# Patient Record
Sex: Female | Born: 1955 | Race: Black or African American | Hispanic: No | Smoking: Current every day smoker
Health system: Southern US, Community
[De-identification: ages and names within clinical notes are randomized; demographics above are authoritative.]

## PROBLEM LIST (undated history)

## (undated) DIAGNOSIS — R05 Cough: Secondary | ICD-10-CM

## (undated) DIAGNOSIS — J069 Acute upper respiratory infection, unspecified: Secondary | ICD-10-CM

## (undated) DIAGNOSIS — J189 Pneumonia, unspecified organism: Secondary | ICD-10-CM

## (undated) DIAGNOSIS — M549 Dorsalgia, unspecified: Secondary | ICD-10-CM

## (undated) DIAGNOSIS — B1920 Unspecified viral hepatitis C without hepatic coma: Secondary | ICD-10-CM

## (undated) DIAGNOSIS — R7303 Prediabetes: Secondary | ICD-10-CM

## (undated) DIAGNOSIS — K7460 Unspecified cirrhosis of liver: Secondary | ICD-10-CM

## (undated) DIAGNOSIS — K859 Acute pancreatitis without necrosis or infection, unspecified: Secondary | ICD-10-CM

## (undated) DIAGNOSIS — I10 Essential (primary) hypertension: Secondary | ICD-10-CM

## (undated) DIAGNOSIS — J42 Unspecified chronic bronchitis: Secondary | ICD-10-CM

## (undated) HISTORY — PX: ABDOMINAL HYSTERECTOMY: SHX81

## (undated) HISTORY — DX: Acute upper respiratory infection, unspecified: J06.9

## (undated) HISTORY — DX: Unspecified viral hepatitis C without hepatic coma: B19.20

## (undated) HISTORY — DX: Unspecified cirrhosis of liver: K74.60

## (undated) HISTORY — DX: Cough: R05

---

## 1997-12-07 ENCOUNTER — Other Ambulatory Visit: Admission: RE | Admit: 1997-12-07 | Discharge: 1997-12-07 | Payer: Self-pay | Admitting: Family Medicine

## 1998-02-22 ENCOUNTER — Emergency Department (HOSPITAL_COMMUNITY): Admission: EM | Admit: 1998-02-22 | Discharge: 1998-02-22 | Payer: Self-pay | Admitting: Emergency Medicine

## 1998-07-18 ENCOUNTER — Emergency Department (HOSPITAL_COMMUNITY): Admission: EM | Admit: 1998-07-18 | Discharge: 1998-07-18 | Payer: Self-pay

## 1999-03-19 ENCOUNTER — Emergency Department (HOSPITAL_COMMUNITY): Admission: EM | Admit: 1999-03-19 | Discharge: 1999-03-19 | Payer: Self-pay | Admitting: Emergency Medicine

## 1999-03-29 ENCOUNTER — Encounter: Admission: RE | Admit: 1999-03-29 | Discharge: 1999-03-29 | Payer: Self-pay | Admitting: Internal Medicine

## 1999-03-29 ENCOUNTER — Ambulatory Visit (HOSPITAL_COMMUNITY): Admission: RE | Admit: 1999-03-29 | Discharge: 1999-03-29 | Payer: Self-pay | Admitting: Internal Medicine

## 1999-03-29 ENCOUNTER — Encounter: Payer: Self-pay | Admitting: Internal Medicine

## 1999-04-08 ENCOUNTER — Encounter: Admission: RE | Admit: 1999-04-08 | Discharge: 1999-04-08 | Payer: Self-pay | Admitting: Internal Medicine

## 1999-04-15 ENCOUNTER — Encounter: Admission: RE | Admit: 1999-04-15 | Discharge: 1999-04-15 | Payer: Self-pay | Admitting: Internal Medicine

## 1999-04-16 ENCOUNTER — Encounter: Admission: RE | Admit: 1999-04-16 | Discharge: 1999-04-16 | Payer: Self-pay | Admitting: Obstetrics & Gynecology

## 1999-04-16 ENCOUNTER — Other Ambulatory Visit: Admission: RE | Admit: 1999-04-16 | Discharge: 1999-04-16 | Payer: Self-pay | Admitting: *Deleted

## 1999-05-07 ENCOUNTER — Encounter: Admission: RE | Admit: 1999-05-07 | Discharge: 1999-05-07 | Payer: Self-pay | Admitting: Obstetrics & Gynecology

## 1999-05-13 ENCOUNTER — Encounter: Admission: RE | Admit: 1999-05-13 | Discharge: 1999-05-13 | Payer: Self-pay | Admitting: Internal Medicine

## 1999-06-28 ENCOUNTER — Encounter: Admission: RE | Admit: 1999-06-28 | Discharge: 1999-06-28 | Payer: Self-pay | Admitting: Internal Medicine

## 2002-12-09 ENCOUNTER — Ambulatory Visit (HOSPITAL_COMMUNITY): Admission: RE | Admit: 2002-12-09 | Discharge: 2002-12-09 | Payer: Self-pay | Admitting: Internal Medicine

## 2002-12-09 ENCOUNTER — Encounter: Payer: Self-pay | Admitting: Internal Medicine

## 2002-12-15 ENCOUNTER — Ambulatory Visit (HOSPITAL_COMMUNITY): Admission: RE | Admit: 2002-12-15 | Discharge: 2002-12-15 | Payer: Self-pay | Admitting: Internal Medicine

## 2003-04-13 ENCOUNTER — Emergency Department (HOSPITAL_COMMUNITY): Admission: EM | Admit: 2003-04-13 | Discharge: 2003-04-13 | Payer: Self-pay | Admitting: Emergency Medicine

## 2003-07-06 ENCOUNTER — Encounter: Admission: RE | Admit: 2003-07-06 | Discharge: 2003-07-06 | Payer: Self-pay | Admitting: Obstetrics and Gynecology

## 2003-08-14 ENCOUNTER — Encounter (INDEPENDENT_AMBULATORY_CARE_PROVIDER_SITE_OTHER): Payer: Self-pay | Admitting: Specialist

## 2003-08-14 ENCOUNTER — Inpatient Hospital Stay (HOSPITAL_COMMUNITY): Admission: RE | Admit: 2003-08-14 | Discharge: 2003-08-17 | Payer: Self-pay | Admitting: Family Medicine

## 2003-08-18 ENCOUNTER — Encounter: Admission: RE | Admit: 2003-08-18 | Discharge: 2003-08-18 | Payer: Self-pay | Admitting: Family Medicine

## 2003-09-01 ENCOUNTER — Encounter: Admission: RE | Admit: 2003-09-01 | Discharge: 2003-09-01 | Payer: Self-pay | Admitting: Family Medicine

## 2003-09-15 ENCOUNTER — Encounter: Admission: RE | Admit: 2003-09-15 | Discharge: 2003-09-15 | Payer: Self-pay | Admitting: Family Medicine

## 2004-05-26 ENCOUNTER — Emergency Department (HOSPITAL_COMMUNITY): Admission: EM | Admit: 2004-05-26 | Discharge: 2004-05-26 | Payer: Self-pay

## 2005-04-23 ENCOUNTER — Emergency Department (HOSPITAL_COMMUNITY): Admission: EM | Admit: 2005-04-23 | Discharge: 2005-04-23 | Payer: Self-pay | Admitting: Emergency Medicine

## 2005-04-30 ENCOUNTER — Ambulatory Visit: Payer: Self-pay | Admitting: Family Medicine

## 2007-07-09 ENCOUNTER — Emergency Department (HOSPITAL_COMMUNITY): Admission: EM | Admit: 2007-07-09 | Discharge: 2007-07-09 | Payer: Self-pay | Admitting: Emergency Medicine

## 2008-11-16 ENCOUNTER — Emergency Department (HOSPITAL_COMMUNITY): Admission: EM | Admit: 2008-11-16 | Discharge: 2008-11-16 | Payer: Self-pay | Admitting: Family Medicine

## 2008-11-22 ENCOUNTER — Encounter: Admission: RE | Admit: 2008-11-22 | Discharge: 2008-11-22 | Payer: Self-pay | Admitting: Emergency Medicine

## 2008-11-27 ENCOUNTER — Encounter: Admission: RE | Admit: 2008-11-27 | Discharge: 2008-11-27 | Payer: Self-pay | Admitting: Emergency Medicine

## 2009-03-16 ENCOUNTER — Emergency Department (HOSPITAL_COMMUNITY): Admission: EM | Admit: 2009-03-16 | Discharge: 2009-03-16 | Payer: Self-pay | Admitting: Emergency Medicine

## 2009-09-07 ENCOUNTER — Emergency Department (HOSPITAL_COMMUNITY): Admission: EM | Admit: 2009-09-07 | Discharge: 2009-09-07 | Payer: Self-pay | Admitting: Emergency Medicine

## 2010-04-05 ENCOUNTER — Emergency Department (HOSPITAL_COMMUNITY): Admission: EM | Admit: 2010-04-05 | Discharge: 2010-04-05 | Payer: Self-pay | Admitting: Emergency Medicine

## 2010-07-08 ENCOUNTER — Encounter: Payer: Self-pay | Admitting: Emergency Medicine

## 2010-09-08 LAB — BRAIN NATRIURETIC PEPTIDE: Pro B Natriuretic peptide (BNP): <30 pg/mL (ref 0.0–100.0)

## 2010-09-23 LAB — CBC
HCT: 41.6 % (ref 36.0–46.0)
Hemoglobin: 14.3 g/dL (ref 12.0–15.0)
MCHC: 34.5 g/dL (ref 30.0–36.0)
MCV: 87.1 fL (ref 78.0–100.0)
Platelets: 217 10*3/uL (ref 150–400)
RBC: 4.77 MIL/uL (ref 3.87–5.11)
RDW: 13 % (ref 11.5–15.5)
WBC: 7.2 10*3/uL (ref 4.0–10.5)

## 2010-09-23 LAB — DIFFERENTIAL
Basophils Absolute: 0 10*3/uL (ref 0.0–0.1)
Basophils Relative: 0 % (ref 0–1)
Eosinophils Absolute: 0.1 10*3/uL (ref 0.0–0.7)
Eosinophils Relative: 2 % (ref 0–5)
Lymphocytes Relative: 39 % (ref 12–46)
Lymphs Abs: 2.8 10*3/uL (ref 0.7–4.0)
Monocytes Absolute: 0.5 10*3/uL (ref 0.1–1.0)
Monocytes Relative: 7 % (ref 3–12)
Neutro Abs: 3.8 10*3/uL (ref 1.7–7.7)
Neutrophils Relative %: 52 % (ref 43–77)

## 2010-09-23 LAB — COMPREHENSIVE METABOLIC PANEL
ALT: 64 U/L — ABNORMAL HIGH (ref 0–35)
AST: 51 U/L — ABNORMAL HIGH (ref 0–37)
Albumin: 3.9 g/dL (ref 3.5–5.2)
Alkaline Phosphatase: 78 U/L (ref 39–117)
BUN: 6 mg/dL (ref 6–23)
CO2: 28 mEq/L (ref 19–32)
Calcium: 10 mg/dL (ref 8.4–10.5)
Chloride: 107 mEq/L (ref 96–112)
Creatinine, Ser: 0.82 mg/dL (ref 0.4–1.2)
GFR calc Af Amer: >60 mL/min (ref >60)
GFR calc non Af Amer: >60 mL/min (ref >60)
Glucose, Bld: 107 mg/dL — ABNORMAL HIGH (ref 70–99)
Potassium: 3.7 mEq/L (ref 3.5–5.1)
Sodium: 139 mEq/L (ref 135–145)
Total Bilirubin: 0.8 mg/dL (ref 0.3–1.2)
Total Protein: 8.1 g/dL (ref 6.0–8.3)

## 2010-09-23 LAB — POCT URINALYSIS DIP (DEVICE)
Bilirubin Urine: NEGATIVE
Glucose, UA: NEGATIVE mg/dL
Hgb urine dipstick: NEGATIVE
Ketones, ur: NEGATIVE mg/dL
Nitrite: NEGATIVE
Protein, ur: NEGATIVE mg/dL
Specific Gravity, Urine: 1.025 (ref 1.005–1.030)
Urobilinogen, UA: 0.2 mg/dL (ref 0.0–1.0)
pH: 5.5 (ref 5.0–8.0)

## 2010-11-01 NOTE — Op Note (Signed)
Melanie Guzman, Melanie Guzman                        ACCOUNT NO.:  [id number]   MEDICAL RECORD NO.:  [id number]                   PATIENT TYPE:  INP   LOCATION:  9317                                 FACILITY:  [facility]   PHYSICIAN:  Tanya S. Shawnie Pons, M.D.                DATE OF BIRTH:  01-10-1956   DATE OF PROCEDURE:  08/14/2003  DATE OF DISCHARGE:                                 OPERATIVE REPORT   PREOPERATIVE DIAGNOSES:  1. Fibroid uterus.  2. Menorrhagia.  3. Dysmenorrhea.   POSTOPERATIVE DIAGNOSES:  1. Fibroid uterus.  2. Menorrhagia.  3. Dysmenorrhea.   PROCEDURES:  1. Total abdominal hysterectomy.  2. Lysis of adhesions.  3. Cystoscopy.   SURGEON:  Shelbie Proctor. Shawnie Pons, M.D.   ASSISTANTS:  Lesly Dukes, M.D., and Javier Glazier. Okey Dupre, M.D.   ANESTHESIA:  General, Burnett Corrente, M.D.   FINDINGS:  Large fibroid uterus.  Numerous adhesions in the pelvis.  There  were also adhesions of the omentum to the uterus.   ESTIMATED BLOOD LOSS:  2500 mL.  The patient then received two units of  packed red blood cells intraoperatively.   COMPLICATIONS:  None.   SPECIMENS:  Uterus to pathology.   REASON FOR PROCEDURE:  The patient is a 55 year old gravida 2, para 2, who  had a history of PID with an IUD in the 1970s, who has a large fibroid  uterus, who continued to have menorrhagia and dysmenorrhea despite being on  Depo-Provera.  She desired permanent solution to her problem.   DESCRIPTION OF PROCEDURE:  The patient was taken to the operating room,  where she was prepped and draped in the usual sterile fashion.  A  Pfannenstiel incision was then used with a knife.  This was carried down to  the underlying fascia with electrocautery.  The fascia was then entered  sharply and the incision extended laterally with the Mayo scissors.  The  superior edge of the fascia was then dissected off the underlying rectus  bluntly laterally and sharply in the midline.  Similarly the posterior edge  was  also dissected off the underlying rectus.  The peritoneal cavity was  then entered sharply and the uterus found.  Some adhesions were noted  initially.  There was an adhesion of the omentum across the anterior portion  of the cervix.  This was doubly clamped and ligated.  Attention was then  turned to the round ligaments, which were grasped, suture ligated, and then  cut with the electrocautery.  A bladder flap was then created and the  bladder pushed down off the uterus.  The tube and ovary were noted to be  stuck to the posterior cul-de-sac, so a decision was made to leave the tube  and ovary.  The tubo-ovarian pedicle was then grasped on the patient's left  side and clamped and secured with a free tie followed by a suture ligature  with  the Kelly clamp remaining on the uterus for back bleeding.  The tubo-  ovarian pedicle was also grasped on the right side and was held with a free  tie and then a suture ligature.  Immediately there was significant bleeding  from that side; however, multiple attempts to try to find the bleeding were  not possible.  It was then noted that because of adhesions, the true tubo-  ovarian ligament was more to the posterior and dorsal of this area, so  clamps were used across this.  There was still some bleeding noted from the  pedicle on that side.  The blood loss got to approximately 1500, and blood  was ordered for the patient.  After this, many of the clamps were then used  for suture ligature until hemostasis was obtained.  An attempt was then made  to elevate the uterus up out of the abdomen; however, it could not be done.  A decision was then made to convert the Pfannenstiel to a Maylard and the  rectus muscles were divided to improve visualization.  It just became more  and more difficult to elevate the uterus out of the pelvis and to be able to  see to get the uterine arteries.  At that point another surgeon was asked to  come in and help with  retraction.  The uterines were then doubly clamped  with a Heaney clamp on the patient's left side and suture ligature put about  each clamp.  The right side uterine artery was similarly clamped with Heaney  clamps x2 and ligated.  There was good hemostasis noted and multiple  myomectomies were done to decrease the bulk of the uterus.  There was  minimal bleeding from the myomectomy sites after the uterines were clamped.  Once the uterus was properly debulked, a Kocher was placed on each side of  the cervix and dissected off.  Suture ligature was then used to hold this  pedicle.  The vagina was then entered sharply with the Mayo scissors and the  cervix was removed.  The  angles of the cardinal ligaments were then sewn in  to the top of the vaginal cuff.  The vaginal cuff closed using a #1 Vicryl  suture in a locked running fashion.  There was still some bleeding from the  edge of the last pedicle to the angle stitch, and this was closed with a  figure-of-eight.  Also, from dissection of trying to get the posterior  peritoneum or the adhesions in the posterior, there was some bleeding in the  pouch of [place].  This was oversewn with a 2-0 Vicryl with good hemostasis  again noted.  The abdominal cavity was then irrigated.  Both ovaries were  noted to be stuck down posteriorly with the tubes.  It was felt that given  the amount of surgery time that this patient has already endured as well as  the amount of bleeding, that it was best to leave these organs in situ.  Should the patient need further surgery for these organs in the future, I  would suggest GYN oncology.  When the abdomen was completely dry and all  instrument, needle, and lap counts were correct, the Balfour retractor was  removed from the incision.  The rectus muscles were inspected and any  bleeders cauterized.  The fascia was closed with a #1 Vicryl suture in a running fashion.  The subcutaneous tissue irrigated, any bleeders   cauterized, and the skin closed using  clips.  Postoperatively the patient  was placed in dorsal lithotomy position in [place] stirrups.  She was given  indigo carmine IV and cystoscopy was performed.  There were noted to be no  holes in the bladder.  The trigone was visualized, and there were blue jets  coming from each ostium.  A picture was taken of each of these.  The patient  was then taken out of dorsal lithotomy.  Attention was turned back to the  abdomen and the abdominal incision then injected with 0.25% Marcaine 10 mL.  The patient tolerated the procedure well.  All instrument, needle, and lap  counts were correct x2.  The patient was awakened and taken to the recovery  room in stable condition.                                               Shelbie Proctor. Shawnie Pons, M.D.    TSP/MEDQ  D:  08/14/2003  T:  08/14/2003  Job:  [id number]

## 2010-11-01 NOTE — Discharge Summary (Signed)
NAMELIBRA, GATZ                        ACCOUNT NO.:  [id number]   MEDICAL RECORD NO.:  [id number]                   PATIENT TYPE:  INP   LOCATION:  9317                                 FACILITY:  [facility]   PHYSICIAN:  Tanya S. Shawnie Pons, M.D.                DATE OF BIRTH:  [birth year]   DATE OF ADMISSION:  08/14/2003  DATE OF DISCHARGE:  08/17/2003                                 DISCHARGE SUMMARY   FINAL DIAGNOSES:  1. Fibroid uterus.  2. Extensive pelvic adhesive disease.  3. History of asthma.   SIGNIFICANT LABORATORY FINDINGS:  Preoperative hemoglobin of 14.1 and  postoperative 9.7. Blood type is O positive. Urine culture was negative.  Other platelet counts and coags were also within normal limits.   OPERATION/PROCEDURE:  The patient had a TAH with lysis of adhesions,  cystoscopy, and two units of packed red blood cells.   REASON FOR ADMISSION:  Briefly, the patient is a 55 year old, gravida 2,  para 2, who had a large fibroid uterus with pain, menorrhagia, and history  of anemia, who failed medical treatment and desired permanent treatment. She  was admitted for the above surgery.   HOSPITAL COURSE:  The patient was admitted on the day of surgery where she  underwent the above procedure. Please see operative note for full details.  However there was extensive dissection, lots of adhesions, and a significant  amount of bleeding with 2.5 liter blood loss. She had two units of packed  red blood cell transfusion intraoperatively and cystoscopy performed to  insure the integrity of the ureters. Postoperatively she was transferred to  the floor where she did well. Her postoperative hemoglobin was 11.4, down to  9.7 on postoperative day #1. She remained afebrile and her catheter was  removed on postoperative day #1.  Her PCA was discontinued. She was  tolerating p.o. On postoperative day #2, she started passing gas and not  having any significant nausea and vomiting. On  postoperative day #3, she had  passed gas, was voiding easily on her own, had no nausea and vomiting, and  was tolerating a p.o. diet, had her pain well controlled with Percocet. At  that time it was felt that she was stable for discharge.   DISCHARGE DISPOSITION AND CONDITION:  The patient discharged home in good  condition. Follow up will be tomorrow for staple removal at 8:30 in the  morning.   DISCHARGE MEDICATIONS:  She can resume her home medications as well as she  is given a prescription for Percocet 5/325 mg one to two p.o. q.4-6h.  p.r.n., #48, with no refills. The patient is also instructed to return with  significant fever, persistent nausea and vomiting. She also has a two week  follow-up in the GYN clinic for postoperative check. She is to do no heavy  lifting for the next six weeks.  Shelbie Proctor. Shawnie Pons, M.D.    TSP/MEDQ  D:  08/17/2003  T:  08/18/2003  Job:  [id number]

## 2010-11-01 NOTE — Group Therapy Note (Signed)
NAME:  Melanie Guzman, CRIBB NO.:  [id number]   MEDICAL RECORD NO.:  [id number]                   PATIENT TYPE:  OUT   LOCATION:  [facility]                           FACILITY:  [facility]   PHYSICIAN:  Tinnie Gens, MD                     DATE OF BIRTH:  May 23, 1956   DATE OF SERVICE:  07/06/2003                                    CLINIC NOTE   CHIEF COMPLAINT:  Fibroid uterus.   HISTORY OF PRESENT ILLNESS:  The patient is a 55 year old gravida 2, para 2-  0-0-2, who is referred by Dr. Audie Box for a large fibroid uterus.  Apparently, her fibroids are getting larger.  Initially, she was having  significant menorrhagia with anemia.  She has been placed on Depo since that  time but has developed significant pain related to her fibroids.  They seem  to be growing in size.  She has had several ultrasounds that have documented  this.  She has also had a sonohysterogram that showed a normal endometrial  canal.  She does have an EMV that I do not have the exact results for.   PAST MEDICAL HISTORY:  Significant for asthma, bronchitis.   PAST SURGICAL HISTORY:  Negative.   OBSTETRICAL HISTORY:  She has had two spontaneous vaginal delivery.   GYNECOLOGICAL HISTORY:  She had a history of PID secondary to IUD placement  in the 70s.  She has a history of abnormal Pap.  Her last was normal in June  of 2004.   ALLERGIES:  None known.   MEDICATIONS:  Depo-Provera as well as hydrocodone p.r.n. for pain.   FAMILY HISTORY:  Colon cancer in her grandmother.  Coronary artery disease,  stroke in a grandmother.   PAST SOCIAL HISTORY:  She has a history of tobacco use at least greater than  1 pack per day.   A 14-point review of systems is reviewed and is negative with the exception  of occasional indigestion.  She denies significant chest pain with exertion  but does complain of some mild dyspnea on exertion related to her smoking.   PHYSICAL EXAMINATION:  VITAL SIGNS:   Temperature is 98, pulse 111, blood  pressure is 142/89, weight 225.4.  GENERAL:  She is a well-developed, well-nourished black female in no acute  distress.  HEENT:  [place]/AT.  Sclerae anicteric.  NECK:  Supple.  Question of enlarged thyroid.  LUNGS:  Clear bilaterally.  HEART:  Regular rate and rhythm.  No rubs, gallops, or murmurs.  BREASTS:  Deferred.  ABDOMEN:  Shows a mass in the lower center quadrant, that is irregular and  consistent with fibroid uterus.  She has no significant lymphadenopathy.  EXTREMITIES:  No cyanosis or clubbing.  She has trace edema.  There is no  significant rash.   IMPRESSION:  Fibroid uterus.   PLAN:  The plan is for a TAH.  Discussed with the patient the  risks and  benefits of this procedure including bleeding, infection, injury to  surrounding organs and even death.  The patient understood these risks and  after a lengthy discussion decided she would like to keep her ovaries.  We  will schedule her for a TAH as soon as possible.                                               Tinnie Gens, MD    TP/MEDQ  D:  07/06/2003  T:  07/07/2003  Job:  [id number]

## 2011-01-08 ENCOUNTER — Emergency Department (HOSPITAL_COMMUNITY): Payer: Self-pay

## 2011-01-08 ENCOUNTER — Emergency Department (HOSPITAL_COMMUNITY)
Admission: EM | Admit: 2011-01-08 | Discharge: 2011-01-08 | Disposition: A | Discharge status: Home / Self Care | Payer: Self-pay | Attending: Emergency Medicine | Admitting: Emergency Medicine

## 2011-01-08 DIAGNOSIS — R5381 Other malaise: Secondary | ICD-10-CM | POA: Insufficient documentation

## 2011-01-08 DIAGNOSIS — R05 Cough: Secondary | ICD-10-CM | POA: Insufficient documentation

## 2011-01-08 DIAGNOSIS — R51 Headache: Secondary | ICD-10-CM | POA: Insufficient documentation

## 2011-01-08 DIAGNOSIS — R110 Nausea: Secondary | ICD-10-CM | POA: Insufficient documentation

## 2011-01-08 DIAGNOSIS — R5383 Other fatigue: Secondary | ICD-10-CM | POA: Insufficient documentation

## 2011-01-08 DIAGNOSIS — R42 Dizziness and giddiness: Secondary | ICD-10-CM | POA: Insufficient documentation

## 2011-01-08 DIAGNOSIS — R059 Cough, unspecified: Secondary | ICD-10-CM | POA: Insufficient documentation

## 2011-01-08 DIAGNOSIS — I10 Essential (primary) hypertension: Secondary | ICD-10-CM | POA: Insufficient documentation

## 2011-01-08 DIAGNOSIS — R109 Unspecified abdominal pain: Secondary | ICD-10-CM | POA: Insufficient documentation

## 2011-01-08 DIAGNOSIS — R079 Chest pain, unspecified: Secondary | ICD-10-CM | POA: Insufficient documentation

## 2011-01-08 LAB — URINALYSIS, ROUTINE W REFLEX MICROSCOPIC
Glucose, UA: NEGATIVE mg/dL
Hgb urine dipstick: NEGATIVE
Leukocytes, UA: NEGATIVE
Nitrite: NEGATIVE
Protein, ur: NEGATIVE mg/dL
Specific Gravity, Urine: 1.019 (ref 1.005–1.030)
Urobilinogen, UA: 0.2 mg/dL (ref 0.0–1.0)
pH: 6 (ref 5.0–8.0)

## 2011-01-08 LAB — DIFFERENTIAL
Basophils Absolute: 0 10*3/uL (ref 0.0–0.1)
Basophils Relative: 0 % (ref 0–1)
Eosinophils Relative: 2 % (ref 0–5)
Lymphs Abs: 2.5 10*3/uL (ref 0.7–4.0)
Monocytes Absolute: 0.5 10*3/uL (ref 0.1–1.0)
Monocytes Relative: 7 % (ref 3–12)
Neutro Abs: 4.1 10*3/uL (ref 1.7–7.7)
Neutrophils Relative %: 56 % (ref 43–77)

## 2011-01-08 LAB — CBC
HCT: 41 % (ref 36.0–46.0)
Hemoglobin: 14.2 g/dL (ref 12.0–15.0)
MCHC: 34.6 g/dL (ref 30.0–36.0)
MCV: 85.4 fL (ref 78.0–100.0)
Platelets: 185 10*3/uL (ref 150–400)
RBC: 4.8 MIL/uL (ref 3.87–5.11)
WBC: 7.2 10*3/uL (ref 4.0–10.5)

## 2011-01-08 LAB — BASIC METABOLIC PANEL
BUN: 7 mg/dL (ref 6–23)
CO2: 25 mEq/L (ref 19–32)
Calcium: 10 mg/dL (ref 8.4–10.5)
Creatinine, Ser: 0.71 mg/dL (ref 0.50–1.10)
GFR calc non Af Amer: >60 mL/min (ref >60)
Glucose, Bld: 94 mg/dL (ref 70–99)
Potassium: 3.8 mEq/L (ref 3.5–5.1)
Sodium: 141 mEq/L (ref 135–145)

## 2011-01-08 LAB — TROPONIN I: Troponin I: <0.3 ng/mL (ref <0.30)

## 2011-01-09 LAB — URINE CULTURE: Colony Count: 15000

## 2011-01-20 ENCOUNTER — Emergency Department (HOSPITAL_COMMUNITY)
Admission: EM | Admit: 2011-01-20 | Discharge: 2011-01-20 | Disposition: A | Discharge status: Home / Self Care | Payer: Self-pay | Attending: Emergency Medicine | Admitting: Emergency Medicine

## 2011-01-20 DIAGNOSIS — M542 Cervicalgia: Secondary | ICD-10-CM | POA: Insufficient documentation

## 2011-01-20 DIAGNOSIS — R079 Chest pain, unspecified: Secondary | ICD-10-CM | POA: Insufficient documentation

## 2011-01-20 DIAGNOSIS — M549 Dorsalgia, unspecified: Secondary | ICD-10-CM | POA: Insufficient documentation

## 2011-01-20 DIAGNOSIS — I10 Essential (primary) hypertension: Secondary | ICD-10-CM | POA: Insufficient documentation

## 2011-01-20 DIAGNOSIS — R51 Headache: Secondary | ICD-10-CM | POA: Insufficient documentation

## 2011-01-20 DIAGNOSIS — R059 Cough, unspecified: Secondary | ICD-10-CM | POA: Insufficient documentation

## 2011-01-20 DIAGNOSIS — R05 Cough: Secondary | ICD-10-CM | POA: Insufficient documentation

## 2011-01-20 DIAGNOSIS — J40 Bronchitis, not specified as acute or chronic: Secondary | ICD-10-CM | POA: Insufficient documentation

## 2011-01-20 DIAGNOSIS — J9801 Acute bronchospasm: Secondary | ICD-10-CM | POA: Insufficient documentation

## 2011-02-03 ENCOUNTER — Emergency Department (HOSPITAL_COMMUNITY)
Admission: EM | Admit: 2011-02-03 | Discharge: 2011-02-04 | Disposition: A | Discharge status: Home / Self Care | Payer: Self-pay | Attending: Emergency Medicine | Admitting: Emergency Medicine

## 2011-02-03 DIAGNOSIS — R110 Nausea: Secondary | ICD-10-CM | POA: Insufficient documentation

## 2011-02-03 DIAGNOSIS — R109 Unspecified abdominal pain: Secondary | ICD-10-CM | POA: Insufficient documentation

## 2011-02-03 DIAGNOSIS — R10819 Abdominal tenderness, unspecified site: Secondary | ICD-10-CM | POA: Insufficient documentation

## 2011-02-03 DIAGNOSIS — I10 Essential (primary) hypertension: Secondary | ICD-10-CM | POA: Insufficient documentation

## 2011-02-03 LAB — URINALYSIS, ROUTINE W REFLEX MICROSCOPIC
Glucose, UA: NEGATIVE mg/dL
Hgb urine dipstick: NEGATIVE
Leukocytes, UA: NEGATIVE
Nitrite: NEGATIVE
Protein, ur: NEGATIVE mg/dL
Specific Gravity, Urine: 1.009 (ref 1.005–1.030)
Urobilinogen, UA: 0.2 mg/dL (ref 0.0–1.0)
pH: 5.5 (ref 5.0–8.0)

## 2011-02-04 ENCOUNTER — Emergency Department (HOSPITAL_COMMUNITY): Payer: Self-pay

## 2011-02-04 LAB — DIFFERENTIAL
Lymphocytes Relative: 31 % (ref 12–46)
Lymphs Abs: 2.8 10*3/uL (ref 0.7–4.0)
Monocytes Relative: 6 % (ref 3–12)
Neutro Abs: 5.4 10*3/uL (ref 1.7–7.7)
Neutrophils Relative %: 61 % (ref 43–77)

## 2011-02-04 LAB — CBC
HCT: 44.8 % (ref 36.0–46.0)
Hemoglobin: 16.1 g/dL — ABNORMAL HIGH (ref 12.0–15.0)
MCH: 30.4 pg (ref 26.0–34.0)
MCV: 84.7 fL (ref 78.0–100.0)
RBC: 5.29 MIL/uL — ABNORMAL HIGH (ref 3.87–5.11)

## 2011-02-04 LAB — COMPREHENSIVE METABOLIC PANEL
ALT: 55 U/L — ABNORMAL HIGH (ref 0–35)
CO2: 30 mEq/L (ref 19–32)
Calcium: 11.2 mg/dL — ABNORMAL HIGH (ref 8.4–10.5)
Creatinine, Ser: 0.69 mg/dL (ref 0.50–1.10)
GFR calc Af Amer: >60 mL/min (ref >60)
GFR calc non Af Amer: >60 mL/min (ref >60)
Glucose, Bld: 113 mg/dL — ABNORMAL HIGH (ref 70–99)
Sodium: 136 mEq/L (ref 135–145)

## 2011-03-04 ENCOUNTER — Other Ambulatory Visit (HOSPITAL_COMMUNITY): Payer: Self-pay | Admitting: Family Medicine

## 2011-03-04 DIAGNOSIS — M545 Low back pain: Secondary | ICD-10-CM

## 2011-03-05 ENCOUNTER — Other Ambulatory Visit (HOSPITAL_COMMUNITY): Payer: Self-pay | Admitting: Family Medicine

## 2011-03-05 DIAGNOSIS — M545 Low back pain: Secondary | ICD-10-CM

## 2011-03-11 ENCOUNTER — Inpatient Hospital Stay (HOSPITAL_COMMUNITY): Admission: RE | Admit: 2011-03-11 | Payer: Self-pay | Source: Ambulatory Visit

## 2011-03-18 ENCOUNTER — Ambulatory Visit (HOSPITAL_COMMUNITY)
Admission: RE | Admit: 2011-03-18 | Discharge: 2011-03-18 | Disposition: A | Discharge status: Home / Self Care | Payer: Self-pay | Source: Ambulatory Visit | Attending: Family Medicine | Admitting: Family Medicine

## 2011-03-18 DIAGNOSIS — IMO0002 Reserved for concepts with insufficient information to code with codable children: Secondary | ICD-10-CM | POA: Insufficient documentation

## 2011-03-18 DIAGNOSIS — M47817 Spondylosis without myelopathy or radiculopathy, lumbosacral region: Secondary | ICD-10-CM | POA: Insufficient documentation

## 2011-03-18 DIAGNOSIS — M48061 Spinal stenosis, lumbar region without neurogenic claudication: Secondary | ICD-10-CM | POA: Insufficient documentation

## 2011-03-18 DIAGNOSIS — M545 Low back pain, unspecified: Secondary | ICD-10-CM | POA: Insufficient documentation

## 2011-03-18 DIAGNOSIS — M79609 Pain in unspecified limb: Secondary | ICD-10-CM | POA: Insufficient documentation

## 2011-05-14 ENCOUNTER — Emergency Department (HOSPITAL_COMMUNITY)
Admission: EM | Admit: 2011-05-14 | Discharge: 2011-05-15 | Disposition: A | Discharge status: Home / Self Care | Payer: Self-pay | Attending: Emergency Medicine | Admitting: Emergency Medicine

## 2011-05-14 ENCOUNTER — Emergency Department (HOSPITAL_COMMUNITY): Payer: Self-pay

## 2011-05-14 DIAGNOSIS — S93609A Unspecified sprain of unspecified foot, initial encounter: Secondary | ICD-10-CM | POA: Insufficient documentation

## 2011-05-14 DIAGNOSIS — M7989 Other specified soft tissue disorders: Secondary | ICD-10-CM | POA: Insufficient documentation

## 2011-05-14 DIAGNOSIS — X500XXA Overexertion from strenuous movement or load, initial encounter: Secondary | ICD-10-CM | POA: Insufficient documentation

## 2011-05-14 HISTORY — DX: Essential (primary) hypertension: I10

## 2011-05-14 NOTE — ED Notes (Signed)
LT foot/ankle pain after missing a step.  Describes foot having externally rotated.  States pain is in bottom of foot (arch) and radiates up to hamstring.

## 2011-05-15 MED ORDER — HYDROCODONE-ACETAMINOPHEN 5-325 MG PO TABS
1.0000 | ORAL_TABLET | Freq: Once | ORAL | Status: AC
  Administered 2011-05-15: 1 via ORAL
  Filled 2011-05-15: qty 1

## 2011-05-15 MED ORDER — IBUPROFEN 800 MG PO TABS: 800.0000 mg | ORAL_TABLET | Freq: Three times a day (TID) | ORAL | Status: AC

## 2011-05-15 MED ORDER — HYDROCODONE-ACETAMINOPHEN 5-325 MG PO TABS: 1.0000 | ORAL_TABLET | ORAL | Status: AC | PRN

## 2011-05-15 NOTE — ED Notes (Signed)
ALP at bedside.

## 2011-05-15 NOTE — ED Provider Notes (Signed)
Medical screening examination/treatment/procedure(s) were performed by non-physician practitioner and as supervising physician I was immediately available for consultation/collaboration.  Olivia Mackie, MD 05/15/11 [phone]

## 2011-05-15 NOTE — ED Provider Notes (Signed)
History     CSN: [id number] Arrival date & time: 05/14/2011  9:32 PM   First MD Initiated Contact with Patient 05/15/11 0258      Chief Complaint  Patient presents with  . Foot Pain    Left foot, tripped 2 days ago.  . Foot Injury    (Consider location/radiation/quality/duration/timing/severity/associated sxs/prior treatment) Patient is a 55 y.o. female presenting with lower extremity pain and foot injury. The history is provided by the patient.  Foot Pain This is a new problem. The current episode started in the past 7 days. The problem occurs constantly. The problem has been unchanged (She twisted her foot while walking 2 days ago and has persistent pain in foot and ankle.). Pertinent negatives include no chills or fever. The symptoms are aggravated by walking and standing.  Foot Injury     Past Medical History  Diagnosis Date  . Hypertension     Past Surgical History  Procedure Date  . Abdominal hysterectomy     partial    History reviewed. No pertinent family history.  History  Substance Use Topics  . Smoking status: Current Everyday Smoker -- 1.0 packs/day  . Smokeless tobacco: Not on file  . Alcohol Use: Yes     occasionally    OB History    Grav Para Term Preterm Abortions TAB SAB Ect Mult Living                  Review of Systems  Constitutional: Negative for fever and chills.  HENT: Negative.   Respiratory: Negative.   Cardiovascular: Negative.   Gastrointestinal: Negative.   Musculoskeletal:       See HPI.  Skin: Negative.   Neurological: Negative.     Allergies  Review of patient's allergies indicates no known allergies.  Home Medications   Current Outpatient Rx  Name Route Sig Dispense Refill  . HYDROCHLOROTHIAZIDE 25 MG PO TABS Oral Take 25 mg by mouth daily.        BP 122/73  Pulse 79  Temp(Src) 98.2 F (36.8 C) (Oral)  Resp 20  SpO2 99%  Physical Exam  Constitutional: She is oriented to person, place, and time. She  appears well-developed and well-nourished.  Neck: Normal range of motion.  Pulmonary/Chest: Effort normal.  Musculoskeletal:       Left foot without swelling or discoloration. Tender to plantar foot overlying 1st MTP. Left ankle swollen medially without discoloration. Joint stable and nontender.   Neurological: She is alert and oriented to person, place, and time.  Skin: Skin is warm and dry.    ED Course  Procedures (including critical care time)  Labs Reviewed - No data to display Dg Ankle Complete Left  05/14/2011  *RADIOLOGY REPORT*  Clinical Data:  Left ankle pain after twisting injury.  LEFT ANKLE COMPLETE - 3+ VIEW  Comparison:  None.  Findings:  There is no evidence of fracture, dislocation, or joint effusion.  There is no evidence of arthropathy or other focal bone abnormality.  Soft tissues are unremarkable.  IMPRESSION: Negative.  Original Report Authenticated By: Reola Calkins, M.D.   Dg Foot Complete Left  05/14/2011  *RADIOLOGY REPORT*  Clinical Data: Twisting injury of left foot.  LEFT FOOT - COMPLETE 3+ VIEW  Comparison:  None.  Findings:  There is no evidence of fracture or dislocation.  There is no evidence of arthropathy or other focal bone abnormality. Soft tissues are unremarkable.  IMPRESSION: Negative.  Original Report Authenticated By: Reola Calkins,  M.D.     No diagnosis found.    MDM          Rodena Medin, PA 05/15/11 [phone]

## 2012-03-18 ENCOUNTER — Encounter (HOSPITAL_COMMUNITY): Payer: Self-pay | Admitting: Emergency Medicine

## 2012-03-18 ENCOUNTER — Emergency Department (HOSPITAL_COMMUNITY): Payer: Self-pay

## 2012-03-18 ENCOUNTER — Emergency Department (HOSPITAL_COMMUNITY)
Admission: EM | Admit: 2012-03-18 | Discharge: 2012-03-18 | Disposition: A | Discharge status: Home / Self Care | Payer: Self-pay | Attending: Emergency Medicine | Admitting: Emergency Medicine

## 2012-03-18 DIAGNOSIS — M549 Dorsalgia, unspecified: Secondary | ICD-10-CM

## 2012-03-18 DIAGNOSIS — M62838 Other muscle spasm: Secondary | ICD-10-CM | POA: Insufficient documentation

## 2012-03-18 DIAGNOSIS — I10 Essential (primary) hypertension: Secondary | ICD-10-CM | POA: Insufficient documentation

## 2012-03-18 LAB — URINALYSIS, ROUTINE W REFLEX MICROSCOPIC
Bilirubin Urine: NEGATIVE
Hgb urine dipstick: NEGATIVE
Protein, ur: NEGATIVE mg/dL
Urobilinogen, UA: 1 mg/dL (ref 0.0–1.0)

## 2012-03-18 MED ORDER — CYCLOBENZAPRINE HCL 10 MG PO TABS: 10.0000 mg | ORAL_TABLET | Freq: Three times a day (TID) | ORAL | Status: DC | PRN

## 2012-03-18 MED ORDER — OXYCODONE-ACETAMINOPHEN 5-325 MG PO TABS: 1.0000 | ORAL_TABLET | ORAL | Status: DC | PRN

## 2012-03-18 MED ORDER — CYCLOBENZAPRINE HCL 10 MG PO TABS
10.0000 mg | ORAL_TABLET | Freq: Once | ORAL | Status: AC
  Administered 2012-03-18: 10 mg via ORAL
  Filled 2012-03-18: qty 1

## 2012-03-18 MED ORDER — IBUPROFEN 800 MG PO TABS
800.0000 mg | ORAL_TABLET | Freq: Once | ORAL | Status: AC
  Administered 2012-03-18: 800 mg via ORAL
  Filled 2012-03-18: qty 1

## 2012-03-18 MED ORDER — OXYCODONE-ACETAMINOPHEN 5-325 MG PO TABS
1.0000 | ORAL_TABLET | Freq: Once | ORAL | Status: AC
  Administered 2012-03-18: 1 via ORAL
  Filled 2012-03-18: qty 1

## 2012-03-18 NOTE — Progress Notes (Deleted)
Pt with no pcp nor coverage CM spoke with pt to review list of self pay pcps to further assist her with prescriptions and health care.  Discussed discounted pharmacies, DSS, health dept, [email or web address] and financial assistance programs in [place].  Pt voiced understanding and appreciation of resources and services offered  

## 2012-03-18 NOTE — ED Notes (Signed)
Back pain- at bra line level, hurts to move, hurts while sitting, no known injury,

## 2012-03-18 NOTE — Progress Notes (Signed)
Pt with no pcp nor coverage CM spoke with pt to review list of self pay pcps to further assist her with prescriptions and health care.  Discussed discounted pharmacies, DSS, health dept, [email or web address] and financial assistance programs in [employer] [employer].  Pt voiced understanding and appreciation of resources and services offered

## 2012-03-18 NOTE — ED Provider Notes (Signed)
History     CSN: [id number]  Arrival date & time 03/18/12  1115   First MD Initiated Contact with Patient 03/18/12 1317      Chief Complaint  Patient presents with  . Back Pain    (Consider location/radiation/quality/duration/timing/severity/associated sxs/prior treatment) Patient is a 56 y.o. female presenting with back pain. The history is provided by the patient.  Back Pain   She has been having pain in her mid back for the last month. Pain is getting worse each day. It is constant and dull. It sometimes radiates up towards her neck. She also points to her right lower posterior rib cages. The pain sometimes moves to. Pain is worse if she lies on it and worse if she moves in certain ways. Nothing makes it better. Pain is moderately severe and she rates it at 8/10. She's tried taking Tylenol and ibuprofen at home with no relief. She denies fever, chills, sweats. She denies cough or dyspnea. She denies weakness, numbness, tingling. She denies bowel or bladder dysfunction other than chronic constipation.   Past Medical History  Diagnosis Date  . Hypertension     Past Surgical History  Procedure Date  . Abdominal hysterectomy     partial    No family history on file.  History  Substance Use Topics  . Smoking status: Current Every Day Smoker -- 1.0 packs/day  . Smokeless tobacco: Not on file  . Alcohol Use: Yes     occasionally    OB History    Grav Para Term Preterm Abortions TAB SAB Ect Mult Living                  Review of Systems  Musculoskeletal: Positive for back pain.  All other systems reviewed and are negative.    Allergies  Review of patient's allergies indicates no known allergies.  Home Medications   Current Outpatient Rx  Name Route Sig Dispense Refill  . HYDROCHLOROTHIAZIDE 25 MG PO TABS Oral Take 25 mg by mouth daily.      . ADULT MULTIVITAMIN W/MINERALS CH Oral Take 1 tablet by mouth daily.      BP 130/76  Pulse 65  Temp 98.6 F (37 C)  (Oral)  Resp 20  SpO2 98%  Physical Exam  Nursing note and vitals reviewed. 56 year old female, resting comfortably and in no acute distress. Vital signs are normal. Oxygen saturation is 98%, which is normal. Head is normocephalic and atraumatic. PERRLA, EOMI. Oropharynx is clear. Neck is nontender and supple without adenopathy or JVD. Back has no tenderness to palpation or percussion. There is moderate paralumbar spasm bilaterally. There is no CVA tenderness. Lungs are clear without rales, wheezes, or rhonchi. Chest is nontender. Heart has regular rate and rhythm without murmur. Abdomen is soft, flat, nontender without masses or hepatosplenomegaly and peristalsis is normoactive. Extremities have no cyanosis or edema, full range of motion is present. Skin is warm and dry without rash. Neurologic: Mental status is normal, cranial nerves are intact, there are no motor or sensory deficits.   ED Course  Procedures (including critical care time)  Dg Thoracic Spine 2 View  03/18/2012  *RADIOLOGY REPORT*  Clinical Data: Back pain for approximately 2 months.  THORACIC SPINE - 2 VIEW  Comparison: PA and lateral chest 09/07/2009.  Findings: There is no fracture.  Scattered loss of disc space height and endplate spurring most notable in the mid to lower thoracic spine appears unchanged.  There is partial visualization of marked  multilevel cervical spondylosis.  Paraspinous structures unremarkable.  IMPRESSION:  1.  No acute finding. 2.  Multilevel cervical and thoracic degenerative disease.   Original Report Authenticated By: Bernadene Bell. D'ALESSIO, M.D.      1. Back pain   2. Muscle spasm       MDM  Lower thoracic pain which seems to be musculoskeletal. However, with some pain going over towards the costovertebral angle area, a urinalysis will be checked. Thoracic spine x-rays will be checked as well. She will be given a therapeutic trial of ibuprofen and cyclobenzaprine. Old records are  reviewed, and she had an MRI scan of her lumbar spine done in 2012 which did include imaging up to T9. There were minor disc bulges and levels of T9-T10, T10-11, and T11-12.   She got moderate relief from oral ibuprofen and cyclobenzaprine. She is sent home with prescription for cyclobenzaprine and Percocet and told to use over-the-counter NSAIDs.    Dione Booze, MD 03/19/12 1452

## 2012-06-11 ENCOUNTER — Emergency Department (HOSPITAL_COMMUNITY): Payer: Self-pay

## 2012-06-11 ENCOUNTER — Encounter (HOSPITAL_COMMUNITY): Payer: Self-pay | Admitting: Emergency Medicine

## 2012-06-11 ENCOUNTER — Emergency Department (HOSPITAL_COMMUNITY)
Admission: EM | Admit: 2012-06-11 | Discharge: 2012-06-11 | Disposition: A | Discharge status: Home / Self Care | Payer: Self-pay | Attending: Emergency Medicine | Admitting: Emergency Medicine

## 2012-06-11 DIAGNOSIS — J189 Pneumonia, unspecified organism: Secondary | ICD-10-CM | POA: Insufficient documentation

## 2012-06-11 DIAGNOSIS — J42 Unspecified chronic bronchitis: Secondary | ICD-10-CM | POA: Insufficient documentation

## 2012-06-11 DIAGNOSIS — F17200 Nicotine dependence, unspecified, uncomplicated: Secondary | ICD-10-CM | POA: Insufficient documentation

## 2012-06-11 DIAGNOSIS — R110 Nausea: Secondary | ICD-10-CM | POA: Insufficient documentation

## 2012-06-11 DIAGNOSIS — R509 Fever, unspecified: Secondary | ICD-10-CM | POA: Insufficient documentation

## 2012-06-11 DIAGNOSIS — R5383 Other fatigue: Secondary | ICD-10-CM | POA: Insufficient documentation

## 2012-06-11 DIAGNOSIS — R5381 Other malaise: Secondary | ICD-10-CM | POA: Insufficient documentation

## 2012-06-11 DIAGNOSIS — Z79899 Other long term (current) drug therapy: Secondary | ICD-10-CM | POA: Insufficient documentation

## 2012-06-11 DIAGNOSIS — I10 Essential (primary) hypertension: Secondary | ICD-10-CM | POA: Insufficient documentation

## 2012-06-11 DIAGNOSIS — Z760 Encounter for issue of repeat prescription: Secondary | ICD-10-CM | POA: Insufficient documentation

## 2012-06-11 HISTORY — DX: Unspecified chronic bronchitis: J42

## 2012-06-11 MED ORDER — LEVOFLOXACIN 500 MG PO TABS: 500.0000 mg | ORAL_TABLET | Freq: Every day | ORAL | Status: DC

## 2012-06-11 MED ORDER — HYDROCHLOROTHIAZIDE 25 MG PO TABS: 25.0000 mg | ORAL_TABLET | Freq: Every day | ORAL | Status: DC

## 2012-06-11 MED ORDER — ALBUTEROL SULFATE (5 MG/ML) 0.5% IN NEBU
5.0000 mg | INHALATION_SOLUTION | Freq: Once | RESPIRATORY_TRACT | Status: AC
  Administered 2012-06-11: 5 mg via RESPIRATORY_TRACT
  Filled 2012-06-11: qty 1

## 2012-06-11 MED ORDER — IPRATROPIUM BROMIDE 0.02 % IN SOLN
0.5000 mg | Freq: Once | RESPIRATORY_TRACT | Status: AC
  Administered 2012-06-11: 0.5 mg via RESPIRATORY_TRACT
  Filled 2012-06-11: qty 2.5

## 2012-06-11 MED ORDER — LEVOFLOXACIN 500 MG PO TABS
750.0000 mg | ORAL_TABLET | Freq: Every day | ORAL | Status: DC
  Administered 2012-06-11: 750 mg via ORAL
  Filled 2012-06-11: qty 2

## 2012-06-11 MED ORDER — GUAIFENESIN 100 MG/5ML PO SOLN
5.0000 mL | Freq: Once | ORAL | Status: AC
  Administered 2012-06-11: 100 mg via ORAL
  Filled 2012-06-11: qty 5

## 2012-06-11 MED ORDER — HYDROCODONE-ACETAMINOPHEN 7.5-500 MG/15ML PO SOLN: 15.0000 mL | Freq: Four times a day (QID) | ORAL | Status: DC | PRN

## 2012-06-11 MED ORDER — ALBUTEROL SULFATE HFA 108 (90 BASE) MCG/ACT IN AERS
2.0000 | INHALATION_SPRAY | RESPIRATORY_TRACT | Status: DC | PRN
  Administered 2012-06-11: 2 via RESPIRATORY_TRACT
  Filled 2012-06-11: qty 6.7

## 2012-06-11 MED ORDER — ACETAMINOPHEN 325 MG PO TABS
650.0000 mg | ORAL_TABLET | Freq: Once | ORAL | Status: AC
  Administered 2012-06-11: 650 mg via ORAL
  Filled 2012-06-11: qty 2

## 2012-06-11 NOTE — ED Provider Notes (Signed)
History     CSN: [id number]  Arrival date & time 06/11/12  1047   First MD Initiated Contact with Patient 06/11/12 1148      Chief Complaint  Patient presents with  . Cough  . Nausea    (Consider location/radiation/quality/duration/timing/severity/associated sxs/prior treatment) HPI  Pt with hx of chronic bronchitis presents for evaluation for persistent cough.  Pt reports for over a week she has had persistent cough, productive with yellow/green phlegm, decreased in appetite, fatigue, and having fever and chills.  Has tried taking Equate at home without relief.  Is a smoker but has decreased her amount.  Is nauseated without vomit or diarrhea.  Denies headache, sneeze, nasal congestion, ear pain, cp, abd pain, or rash.  Pt also request to have her HCTZ refilled as she has been without it for over a month.    Past Medical History  Diagnosis Date  . Hypertension   . Chronic bronchitis     Past Surgical History  Procedure Date  . Abdominal hysterectomy     partial    History reviewed. No pertinent family history.  History  Substance Use Topics  . Smoking status: Current Every Day Smoker -- 1.0 packs/day  . Smokeless tobacco: Not on file  . Alcohol Use: Yes     Comment: occasionally    OB History    Grav Para Term Preterm Abortions TAB SAB Ect Mult Living                  Review of Systems  Constitutional: Positive for fever, chills and fatigue.  HENT: Negative for ear pain, congestion, sneezing and neck pain.   Respiratory: Positive for cough. Negative for shortness of breath.   Cardiovascular: Negative for chest pain.  Gastrointestinal: Negative for abdominal pain.  Skin: Negative for rash.    Allergies  Review of patient's allergies indicates no known allergies.  Home Medications   Current Outpatient Rx  Name  Route  Sig  Dispense  Refill  . ACETAMINOPHEN 500 MG PO TABS   Oral   Take 500 mg by mouth every 6 (six) hours as needed. Pain         .  CYCLOBENZAPRINE HCL 10 MG PO TABS   Oral   Take 1 tablet (10 mg total) by mouth 3 (three) times daily as needed for muscle spasms.   30 tablet   0   . HYDROCHLOROTHIAZIDE 25 MG PO TABS   Oral   Take 25 mg by mouth daily.           . ADULT MULTIVITAMIN W/MINERALS CH   Oral   Take 1 tablet by mouth daily.           BP 121/72  Pulse 88  Temp 101.1 F (38.4 C) (Oral)  Resp 18  SpO2 95%  Physical Exam  Nursing note and vitals reviewed. Constitutional: She appears well-developed and well-nourished. No distress.       Awake, alert, nontoxic appearance  HENT:  Head: Atraumatic.  Right Ear: External ear normal.  Left Ear: External ear normal.  Nose: Nose normal.  Mouth/Throat: Oropharynx is clear and moist. No oropharyngeal exudate.  Eyes: Conjunctivae normal are normal. Right eye exhibits no discharge. Left eye exhibits no discharge.  Neck: Neck supple.  Cardiovascular: Normal rate and regular rhythm.   Pulmonary/Chest: Effort normal. No respiratory distress (no accessory muscle use). She has wheezes (scattered wheezes and rhonchi heard.). She has no rales. She exhibits no tenderness.  Abdominal: Soft.  There is no tenderness. There is no rebound.  Musculoskeletal: She exhibits no edema and no tenderness.       ROM appears intact, no obvious focal weakness  Neurological:       Mental status and motor strength appears intact  Skin: No rash noted.  Psychiatric: She has a normal mood and affect.    ED Course  Procedures (including critical care time)  Labs Reviewed - No data to display No results found.   No diagnosis found.  Results for orders placed during the hospital encounter of 03/18/12  URINALYSIS, ROUTINE W REFLEX MICROSCOPIC      Component Value Range   Color, Urine YELLOW  YELLOW   APPearance CLOUDY (*) CLEAR   Specific Gravity, Urine 1.012  1.005 - 1.030   pH 6.5  5.0 - 8.0   Glucose, UA NEGATIVE  NEGATIVE mg/dL   Hgb urine dipstick NEGATIVE  NEGATIVE    Bilirubin Urine NEGATIVE  NEGATIVE   Ketones, ur NEGATIVE  NEGATIVE mg/dL   Protein, ur NEGATIVE  NEGATIVE mg/dL   Urobilinogen, UA 1.0  0.0 - 1.0 mg/dL   Nitrite NEGATIVE  NEGATIVE   Leukocytes, UA NEGATIVE  NEGATIVE   Dg Chest 2 View  06/11/2012  *RADIOLOGY REPORT*  Clinical Data: Cough and fever.  CHEST - 2 VIEW  Comparison: 09/07/2009  Findings: Mild perihilar and bibasilar interstitial infiltrates or edema, new since prior exam.  There is some mild prominence of the interlobar fissures, increased since previous exam.  No effusion. Heart size normal.  Mildly tortuous thoracic aorta.  Regional bones unremarkable.  IMPRESSION:  Mild perihilar and bibasilar interstitial infiltrate   Original Report Authenticated By: D. Andria Rhein, MD     1.  Community acquired pneumonia  MDM  Pt with hx of chronic bronchitis presents with worsening cough that felt similar to her hx of bronchitis.  Is a smoker.  LUng exam remarkable for scattered wheezes and rhonchi.  Cough medication, and breathing treatment administered.  CXR ordered.    Pt also request for refill of HCTZ.  She takes hydrodiuril 25mg  PO daily.  BP is normal here in ED.     1:43 PM CXR reviewed by me shows mild perihilar and bibasilar interstitial infiltrate.  Will treat for community acquired pneumonia with Levaquin 750mg  daily for 7 days.  Albuterol inhaler given here in ER. Antipyretic prescribed. Pt recommend smoking cessation and have pt to f/u for repeat cxr in a week.  Pt stable for discharge.    BP 121/72  Pulse 88  Temp 101.1 F (38.4 C) (Oral)  Resp 18  SpO2 95%  I have reviewed nursing notes and vital signs. I personally reviewed the imaging tests through PACS system  I reviewed available ER/hospitalization records thought the EMR      Fayrene Helper, [place] 06/11/12 1348

## 2012-06-11 NOTE — ED Notes (Addendum)
Pt w/ hx of chronic bronchitis.  Has had a cough for 8 days.  States that she is nauseated.  Has been coughing up green phlegm.

## 2012-06-12 NOTE — ED Provider Notes (Signed)
Medical screening examination/treatment/procedure(s) were performed by non-physician practitioner and as supervising physician I was immediately available for consultation/collaboration.   Suzi Roots, MD 06/12/12 0730

## 2012-07-02 ENCOUNTER — Emergency Department (HOSPITAL_COMMUNITY): Payer: Self-pay

## 2012-07-02 ENCOUNTER — Emergency Department (HOSPITAL_COMMUNITY)
Admission: EM | Admit: 2012-07-02 | Discharge: 2012-07-02 | Disposition: A | Discharge status: Home / Self Care | Payer: Self-pay | Attending: Emergency Medicine | Admitting: Emergency Medicine

## 2012-07-02 ENCOUNTER — Encounter (HOSPITAL_COMMUNITY): Payer: Self-pay | Admitting: Emergency Medicine

## 2012-07-02 DIAGNOSIS — I10 Essential (primary) hypertension: Secondary | ICD-10-CM | POA: Insufficient documentation

## 2012-07-02 DIAGNOSIS — F17200 Nicotine dependence, unspecified, uncomplicated: Secondary | ICD-10-CM | POA: Insufficient documentation

## 2012-07-02 DIAGNOSIS — Z79899 Other long term (current) drug therapy: Secondary | ICD-10-CM | POA: Insufficient documentation

## 2012-07-02 DIAGNOSIS — R059 Cough, unspecified: Secondary | ICD-10-CM | POA: Insufficient documentation

## 2012-07-02 DIAGNOSIS — R05 Cough: Secondary | ICD-10-CM | POA: Insufficient documentation

## 2012-07-02 DIAGNOSIS — M6281 Muscle weakness (generalized): Secondary | ICD-10-CM | POA: Insufficient documentation

## 2012-07-02 DIAGNOSIS — R062 Wheezing: Secondary | ICD-10-CM | POA: Insufficient documentation

## 2012-07-02 DIAGNOSIS — Z8701 Personal history of pneumonia (recurrent): Secondary | ICD-10-CM | POA: Insufficient documentation

## 2012-07-02 LAB — BASIC METABOLIC PANEL
CO2: 31 mEq/L (ref 19–32)
Calcium: 9.8 mg/dL (ref 8.4–10.5)
Creatinine, Ser: 0.66 mg/dL (ref 0.50–1.10)
GFR calc non Af Amer: >90 mL/min (ref >90)
Glucose, Bld: 147 mg/dL — ABNORMAL HIGH (ref 70–99)
Sodium: 138 mEq/L (ref 135–145)

## 2012-07-02 LAB — CBC
MCH: 29.6 pg (ref 26.0–34.0)
MCHC: 34.6 g/dL (ref 30.0–36.0)
MCV: 85.6 fL (ref 78.0–100.0)
Platelets: 184 10*3/uL (ref 150–400)
RBC: 4.59 MIL/uL (ref 3.87–5.11)
RDW: 12.9 % (ref 11.5–15.5)

## 2012-07-02 MED ORDER — ALBUTEROL SULFATE (5 MG/ML) 0.5% IN NEBU
5.0000 mg | INHALATION_SOLUTION | Freq: Once | RESPIRATORY_TRACT | Status: AC
  Administered 2012-07-02: 5 mg via RESPIRATORY_TRACT
  Filled 2012-07-02: qty 1

## 2012-07-02 MED ORDER — ALBUTEROL SULFATE HFA 108 (90 BASE) MCG/ACT IN AERS: 2.0000 | INHALATION_SPRAY | RESPIRATORY_TRACT | Status: DC | PRN

## 2012-07-02 NOTE — ED Provider Notes (Signed)
History     CSN: [id number]  Arrival date & time 07/02/12  1125   First MD Initiated Contact with Patient 07/02/12 1225      Chief Complaint  Patient presents with  . Extremity Weakness  . Wheezing    (Consider location/radiation/quality/duration/timing/severity/associated sxs/prior treatment) Patient is a 57 y.o. female presenting with wheezing. The history is provided by the patient.  Wheezing  Associated symptoms include cough and wheezing. Pertinent negatives include no chest pain, no fever and no shortness of breath.  pt states recent tx for pna. Completed course of antibiotic. States while cough is improved, it hasnt resolved. Also states feels wheezing/congestion in chest at times. No chest pain. No sob. No fever or chills. No sore throat, headache, body aches or other flu symptoms. No swelling. No orthopnea or pnd. No hx copd or asthma. +hx bronchitis. +smoker.    Past Medical History  Diagnosis Date  . Hypertension   . Chronic bronchitis     Past Surgical History  Procedure Date  . Abdominal hysterectomy     partial    No family history on file.  History  Substance Use Topics  . Smoking status: Current Every Day Smoker -- 1.0 packs/day  . Smokeless tobacco: Not on file  . Alcohol Use: Yes     Comment: occasionally    OB History    Grav Para Term Preterm Abortions TAB SAB Ect Mult Living                  Review of Systems  Constitutional: Negative for fever and chills.  HENT: Negative for neck pain.   Eyes: Negative for redness.  Respiratory: Positive for cough and wheezing. Negative for shortness of breath.   Cardiovascular: Negative for chest pain and leg swelling.  Gastrointestinal: Negative for abdominal pain.  Genitourinary: Negative for flank pain.  Musculoskeletal: Negative for back pain.  Skin: Negative for rash.  Neurological: Negative for headaches.  Hematological: Does not bruise/bleed easily.  Psychiatric/Behavioral: Negative for  confusion.    Allergies  Review of patient's allergies indicates no known allergies.  Home Medications   Current Outpatient Rx  Name  Route  Sig  Dispense  Refill  . ACETAMINOPHEN 500 MG PO TABS   Oral   Take 500 mg by mouth every 6 (six) hours as needed. Pain         . CYCLOBENZAPRINE HCL 10 MG PO TABS   Oral   Take 1 tablet (10 mg total) by mouth 3 (three) times daily as needed for muscle spasms.   30 tablet   0   . HYDROCHLOROTHIAZIDE 25 MG PO TABS   Oral   Take 1 tablet (25 mg total) by mouth daily.   30 tablet   3   . ADULT MULTIVITAMIN W/MINERALS CH   Oral   Take 1 tablet by mouth daily.         Marland Kitchen LEVOFLOXACIN 500 MG PO TABS   Oral   Take 1 tablet (500 mg total) by mouth daily.   10 tablet   0     BP 124/77  Pulse 98  Temp 98.3 F (36.8 C) (Oral)  Resp 19  SpO2 98%  Physical Exam  Nursing note and vitals reviewed. Constitutional: She appears well-developed and well-nourished. No distress.  HENT:  Nose: Nose normal.  Mouth/Throat: Oropharynx is clear and moist.  Eyes: Conjunctivae normal are normal. No scleral icterus.  Neck: Neck supple. No tracheal deviation present.  Cardiovascular: Normal rate,  regular rhythm, normal heart sounds and intact distal pulses.   Pulmonary/Chest: Effort normal. No respiratory distress. She has wheezes.  Abdominal: Soft. Normal appearance and bowel sounds are normal. She exhibits no distension. There is no tenderness.  Musculoskeletal: She exhibits no edema and no tenderness.  Neurological: She is alert.  Skin: Skin is warm and dry. No rash noted.  Psychiatric: She has a normal mood and affect.    ED Course  Procedures (including critical care time)   Results for orders placed during the hospital encounter of 07/02/12  BASIC METABOLIC PANEL      Component Value Range   Sodium 138  135 - 145 mEq/L   Potassium 3.1 (*) 3.5 - 5.1 mEq/L   Chloride 99  96 - 112 mEq/L   CO2 31  19 - 32 mEq/L   Glucose, Bld 147  (*) 70 - 99 mg/dL   BUN 6  6 - 23 mg/dL   Creatinine, Ser 4.09  0.50 - 1.10 mg/dL   Calcium 9.8  8.4 - 81.1 mg/dL   GFR calc non Af Amer >90  >90 mL/min   GFR calc Af Amer >90  >90 mL/min  CBC      Component Value Range   WBC 6.6  4.0 - 10.5 K/uL   RBC 4.59  3.87 - 5.11 MIL/uL   Hemoglobin 13.6  12.0 - 15.0 g/dL   HCT 91.4  78.2 - 95.6 %   MCV 85.6  78.0 - 100.0 fL   MCH 29.6  26.0 - 34.0 pg   MCHC 34.6  30.0 - 36.0 g/dL   RDW 21.3  08.6 - 57.8 %   Platelets 184  150 - 400 K/uL   Dg Chest 2 View  07/02/2012  *RADIOLOGY REPORT*  Clinical Data: Cough, chronic bronchitis  CHEST - 2 VIEW  Comparison: 06/11/2012  Findings: Cardiomediastinal silhouette is stable.  No acute infiltrate or pleural effusion.  No pulmonary edema.  Central mild bronchitic changes.  Bony thorax is stable.  IMPRESSION: No acute infiltrate or pulmonary edema.  Central mild bronchitic changes.   Original Report Authenticated By: Natasha Mead, M.D.    Dg Chest 2 View  06/11/2012  *RADIOLOGY REPORT*  Clinical Data: Cough and fever.  CHEST - 2 VIEW  Comparison: 09/07/2009  Findings: Mild perihilar and bibasilar interstitial infiltrates or edema, new since prior exam.  There is some mild prominence of the interlobar fissures, increased since previous exam.  No effusion. Heart size normal.  Mildly tortuous thoracic aorta.  Regional bones unremarkable.  IMPRESSION:  Mild perihilar and bibasilar interstitial infiltrate   Original Report Authenticated By: D. Andria Rhein, MD       MDM  Albuterol neb.  Cxr.  Reviewed nursing notes and prior charts for additional history.    Recheck no increased wob. No wheezing.   Pt stable for d/c.        Suzi Roots, MD 07/02/12 1406

## 2012-07-02 NOTE — ED Notes (Signed)
States that she was dx with pneumonia and took her last dose of antibiotics on last Friday. States that she is still wheezing and has weakness. Wasn't able to get the script filled for the coughing.

## 2012-10-25 ENCOUNTER — Encounter (HOSPITAL_COMMUNITY): Payer: Self-pay | Admitting: Emergency Medicine

## 2012-10-25 ENCOUNTER — Emergency Department (HOSPITAL_COMMUNITY)
Admission: EM | Admit: 2012-10-25 | Discharge: 2012-10-25 | Disposition: A | Discharge status: Home / Self Care | Payer: Self-pay | Attending: Emergency Medicine | Admitting: Emergency Medicine

## 2012-10-25 DIAGNOSIS — Z8709 Personal history of other diseases of the respiratory system: Secondary | ICD-10-CM | POA: Insufficient documentation

## 2012-10-25 DIAGNOSIS — Z90710 Acquired absence of both cervix and uterus: Secondary | ICD-10-CM | POA: Insufficient documentation

## 2012-10-25 DIAGNOSIS — R197 Diarrhea, unspecified: Secondary | ICD-10-CM | POA: Insufficient documentation

## 2012-10-25 DIAGNOSIS — F17200 Nicotine dependence, unspecified, uncomplicated: Secondary | ICD-10-CM | POA: Insufficient documentation

## 2012-10-25 DIAGNOSIS — R350 Frequency of micturition: Secondary | ICD-10-CM | POA: Insufficient documentation

## 2012-10-25 DIAGNOSIS — I10 Essential (primary) hypertension: Secondary | ICD-10-CM | POA: Insufficient documentation

## 2012-10-25 DIAGNOSIS — K859 Acute pancreatitis without necrosis or infection, unspecified: Secondary | ICD-10-CM | POA: Insufficient documentation

## 2012-10-25 DIAGNOSIS — R110 Nausea: Secondary | ICD-10-CM | POA: Insufficient documentation

## 2012-10-25 DIAGNOSIS — Z79899 Other long term (current) drug therapy: Secondary | ICD-10-CM | POA: Insufficient documentation

## 2012-10-25 LAB — URINALYSIS, ROUTINE W REFLEX MICROSCOPIC
Bilirubin Urine: NEGATIVE
Glucose, UA: NEGATIVE mg/dL
Hgb urine dipstick: NEGATIVE
Ketones, ur: NEGATIVE mg/dL
Leukocytes, UA: NEGATIVE
Nitrite: NEGATIVE
Protein, ur: NEGATIVE mg/dL
Specific Gravity, Urine: 1.017 (ref 1.005–1.030)
Urobilinogen, UA: 0.2 mg/dL (ref 0.0–1.0)
pH: 5 (ref 5.0–8.0)

## 2012-10-25 LAB — COMPREHENSIVE METABOLIC PANEL WITH GFR
Albumin: 4.1 g/dL (ref 3.5–5.2)
Alkaline Phosphatase: 72 U/L (ref 39–117)
BUN: 9 mg/dL (ref 6–23)
Chloride: 99 meq/L (ref 96–112)
GFR calc Af Amer: >90 mL/min (ref >90)
Glucose, Bld: 117 mg/dL — ABNORMAL HIGH (ref 70–99)
Potassium: 3.2 meq/L — ABNORMAL LOW (ref 3.5–5.1)
Total Bilirubin: 0.5 mg/dL (ref 0.3–1.2)

## 2012-10-25 LAB — COMPREHENSIVE METABOLIC PANEL
ALT: 47 U/L — ABNORMAL HIGH (ref 0–35)
AST: 44 U/L — ABNORMAL HIGH (ref 0–37)
CO2: 25 mEq/L (ref 19–32)
Calcium: 10.6 mg/dL — ABNORMAL HIGH (ref 8.4–10.5)
Creatinine, Ser: 0.77 mg/dL (ref 0.50–1.10)
GFR calc non Af Amer: >90 mL/min (ref >90)
Sodium: 136 mEq/L (ref 135–145)
Total Protein: 8.7 g/dL — ABNORMAL HIGH (ref 6.0–8.3)

## 2012-10-25 LAB — CBC WITH DIFFERENTIAL/PLATELET
Basophils Absolute: 0.1 10*3/uL (ref 0.0–0.1)
Basophils Relative: 1 % (ref 0–1)
Eosinophils Absolute: 0.1 10*3/uL (ref 0.0–0.7)
Eosinophils Relative: 1 % (ref 0–5)
HCT: 43.2 % (ref 36.0–46.0)
Hemoglobin: 15.4 g/dL — ABNORMAL HIGH (ref 12.0–15.0)
Lymphocytes Relative: 29 % (ref 12–46)
Lymphs Abs: 2.8 K/uL (ref 0.7–4.0)
MCH: 29.7 pg (ref 26.0–34.0)
MCHC: 35.6 g/dL (ref 30.0–36.0)
MCV: 83.4 fL (ref 78.0–100.0)
Monocytes Absolute: 0.8 K/uL (ref 0.1–1.0)
Monocytes Relative: 8 % (ref 3–12)
Neutro Abs: 5.9 10*3/uL (ref 1.7–7.7)
Neutrophils Relative %: 61 % (ref 43–77)
Platelets: 221 10*3/uL (ref 150–400)
RBC: 5.18 MIL/uL — ABNORMAL HIGH (ref 3.87–5.11)
RDW: 12.9 % (ref 11.5–15.5)
WBC: 9.7 10*3/uL (ref 4.0–10.5)

## 2012-10-25 LAB — LIPASE, BLOOD: Lipase: 116 U/L — ABNORMAL HIGH (ref 11–59)

## 2012-10-25 MED ORDER — ONDANSETRON 4 MG PO TBDP: 4.0000 mg | ORAL_TABLET | Freq: Three times a day (TID) | ORAL | Status: DC | PRN

## 2012-10-25 MED ORDER — ONDANSETRON HCL 4 MG/2ML IJ SOLN
4.0000 mg | Freq: Once | INTRAMUSCULAR | Status: AC
  Administered 2012-10-25: 4 mg via INTRAVENOUS
  Filled 2012-10-25: qty 2

## 2012-10-25 MED ORDER — OXYCODONE HCL 5 MG PO TABS: 5.0000 mg | ORAL_TABLET | ORAL | Status: DC | PRN

## 2012-10-25 MED ORDER — MORPHINE SULFATE 4 MG/ML IJ SOLN
4.0000 mg | Freq: Once | INTRAMUSCULAR | Status: AC
  Administered 2012-10-25: 4 mg via INTRAVENOUS
  Filled 2012-10-25: qty 1

## 2012-10-25 NOTE — ED Provider Notes (Signed)
Medical screening examination/treatment/procedure(s) were conducted as a shared visit with non-physician practitioner(s) and myself.  I personally evaluated the patient during the encounter  Pt with upper epigastric pain, mild tenderness, no rebound or guard.  Pt with lipase of 116.  No sig vomiting.  Pt has had U/S in the past suggesting mass around pancreas, neg CT in 2010.  Then U/S again in 2012 showing a node around pancreas.  Pt denies sig alcohol.  Pt doesn't require admission at this time.  Encouraged to follow up with PCP and GI.  NPO for 12 hours, then fluid diet for 24, etc.  Advnace as tolerated at home. Instructed to return if worse or other concerns.     Impression: Pancreatitis   Melanie Guzman. Oletta Lamas, MD 10/25/12 2338

## 2012-10-25 NOTE — ED Notes (Signed)
Pt complains of abdominal pain and nausea x 1 day

## 2012-10-25 NOTE — ED Provider Notes (Signed)
History     CSN: [id number]  Arrival date & time 10/25/12  1814   First MD Initiated Contact with Patient 10/25/12 1854      Chief Complaint  Patient presents with  . Abdominal Pain    (Consider location/radiation/quality/duration/timing/severity/associated sxs/prior treatment) HPI Comments: Pt with PMH significant for HTN and chronic bronchitis presents to the ED for abdominal pain and nausea x 1 day.  Pain is described as sharp, localized to the epigastric region, and non-radiating.  Nausea without episodes of vomiting.  BM earlier today which was small, loose, and non-bloody.  Pt endorses some increased frequency of urination which she thinks is due to her HCTZ.  No associated dysuria or hematuria.  No recent EtOH or intake of greasy/fatty foods. Denies any chest pain, SOB, palpitations, dizziness, weakness, or generalized fatigue.  No recent sick contacts, fevers, sweats, chills.  Chart reviewed:  Seen in the ED a few times in the past for similar complaints with imagining studies performed which were negative for acute processes.  Small peripancreatic node on abd u/s 02/03/11.  No prior GB or liver disease noted.  Patient is a 57 y.o. female presenting with abdominal pain. The history is provided by the patient.  Abdominal Pain Associated symptoms: nausea     Past Medical History  Diagnosis Date  . Hypertension   . Chronic bronchitis     Past Surgical History  Procedure Laterality Date  . Abdominal hysterectomy      partial    No family history on file.  History  Substance Use Topics  . Smoking status: Current Every Day Smoker -- 1.00 packs/day  . Smokeless tobacco: Not on file  . Alcohol Use: Yes     Comment: occasionally    OB History   Grav Para Term Preterm Abortions TAB SAB Ect Mult Living                  Review of Systems  Gastrointestinal: Positive for nausea and abdominal pain.  All other systems reviewed and are negative.    Allergies  Review  of patient's allergies indicates no known allergies.  Home Medications   Current Outpatient Rx  Name  Route  Sig  Dispense  Refill  . albuterol (PROVENTIL HFA;VENTOLIN HFA) 108 (90 BASE) MCG/ACT inhaler   Inhalation   Inhale 2 puffs into the lungs every 4 (four) hours as needed for wheezing.   1 Inhaler   1   . cyclobenzaprine (FLEXERIL) 10 MG tablet   Oral   Take 1 tablet (10 mg total) by mouth 3 (three) times daily as needed for muscle spasms.   30 tablet   0   . hydrochlorothiazide (HYDRODIURIL) 25 MG tablet   Oral   Take 1 tablet (25 mg total) by mouth daily.   30 tablet   3   . Multiple Vitamin (MULTIVITAMIN WITH MINERALS) TABS   Oral   Take 1 tablet by mouth daily.           BP 129/80  Pulse 80  Temp(Src) 98.4 F (36.9 C) (Oral)  Resp 18  SpO2 98%  Physical Exam  Nursing note and vitals reviewed. Constitutional: She is oriented to person, place, and time. She appears well-developed and well-nourished. No distress.  HENT:  Head: Normocephalic and atraumatic.  Mouth/Throat: Uvula is midline, oropharynx is clear and moist and mucous membranes are normal.  Eyes: Conjunctivae and EOM are normal. Pupils are equal, round, and reactive to light.  Neck: Normal range  of motion.  Cardiovascular: Normal rate, regular rhythm and normal heart sounds.   Pulmonary/Chest: Effort normal and breath sounds normal. No respiratory distress.  Abdominal: Soft. Bowel sounds are normal. There is tenderness in the epigastric area. There is no CVA tenderness, no tenderness at McBurney's point and negative Murphy's sign.  Musculoskeletal: Normal range of motion.  Neurological: She is alert and oriented to person, place, and time.  Skin: Skin is warm and dry.  Psychiatric: She has a normal mood and affect.    ED Course  Procedures (including critical care time)  Labs Reviewed  CBC WITH DIFFERENTIAL - Abnormal; Notable for the following:    RBC 5.18 (*)    Hemoglobin 15.4 (*)     All other components within normal limits  COMPREHENSIVE METABOLIC PANEL - Abnormal; Notable for the following:    Potassium 3.2 (*)    Glucose, Bld 117 (*)    Calcium 10.6 (*)    Total Protein 8.7 (*)    AST 44 (*)    ALT 47 (*)    All other components within normal limits  LIPASE, BLOOD - Abnormal; Notable for the following:    Lipase 116 (*)    All other components within normal limits  URINALYSIS, ROUTINE W REFLEX MICROSCOPIC   No results found.   1. Pancreatitis   2. Nausea       MDM   57 y.o. F presenting to the ED with 1 day hx of epigastric abdominal pain and nausea.  Labs consistent with acute pancreatitis.  Symptoms well-controlled with IVF, Zofran, morphine.  Patient afebrile, nontoxic-appearing, NAD, vital signs stable at this time-okay for discharge and follow-up as outpatient.  Given similar ED visits in the past, patient will be referred to [facility] for further evaluation.  Also encouraged to establish care with a primary care physician in the area, resource guide given. Rx Zofran and oxycodone. Discussed plan with patient, she agreed.  Return precautions advised.  Discussed pt with Dr. Oletta Lamas who agrees with plan.       Garlon Hatchet, PA-C 10/25/12 2326

## 2012-11-30 ENCOUNTER — Ambulatory Visit: Payer: No Typology Code available for payment source | Attending: Family Medicine | Admitting: Internal Medicine

## 2012-11-30 ENCOUNTER — Telehealth: Payer: Self-pay | Admitting: *Deleted

## 2012-11-30 VITALS — BP 114/78 | HR 80 | Temp 98.7°F | Resp 16 | Wt 215.8 lb

## 2012-11-30 DIAGNOSIS — R1013 Epigastric pain: Secondary | ICD-10-CM | POA: Insufficient documentation

## 2012-11-30 DIAGNOSIS — R1011 Right upper quadrant pain: Secondary | ICD-10-CM

## 2012-11-30 DIAGNOSIS — M549 Dorsalgia, unspecified: Secondary | ICD-10-CM | POA: Insufficient documentation

## 2012-11-30 DIAGNOSIS — F341 Dysthymic disorder: Secondary | ICD-10-CM | POA: Insufficient documentation

## 2012-11-30 DIAGNOSIS — Z0000 Encounter for general adult medical examination without abnormal findings: Secondary | ICD-10-CM

## 2012-11-30 DIAGNOSIS — F17200 Nicotine dependence, unspecified, uncomplicated: Secondary | ICD-10-CM | POA: Insufficient documentation

## 2012-11-30 DIAGNOSIS — F32A Depression, unspecified: Secondary | ICD-10-CM | POA: Insufficient documentation

## 2012-11-30 DIAGNOSIS — F329 Major depressive disorder, single episode, unspecified: Secondary | ICD-10-CM | POA: Insufficient documentation

## 2012-11-30 DIAGNOSIS — Z720 Tobacco use: Secondary | ICD-10-CM | POA: Insufficient documentation

## 2012-11-30 DIAGNOSIS — I10 Essential (primary) hypertension: Secondary | ICD-10-CM | POA: Insufficient documentation

## 2012-11-30 MED ORDER — TIZANIDINE HCL 2 MG PO CAPS: 2.0000 mg | ORAL_CAPSULE | Freq: Three times a day (TID) | ORAL | Status: DC | PRN

## 2012-11-30 MED ORDER — SERTRALINE HCL 50 MG PO TABS: 50.0000 mg | ORAL_TABLET | Freq: Every day | ORAL | Status: DC

## 2012-11-30 MED ORDER — HYDROCHLOROTHIAZIDE 25 MG PO TABS: 25.0000 mg | ORAL_TABLET | Freq: Every day | ORAL | Status: DC

## 2012-11-30 MED ORDER — NICOTINE 14 MG/24HR TD PT24: 1.0000 | MEDICATED_PATCH | TRANSDERMAL | Status: DC

## 2012-11-30 NOTE — Progress Notes (Signed)
Patient Demographics  Melanie Guzman, is a 57 y.o. female  ZOX:[id number]  WJX:[id number]  DOB - Mar 05, 1956  Chief Complaint  Patient presents with  . Establish Care        Subjective:   Melanie Guzman presents today to establish care. She was recently seen in the emergency room with abdominal pain, found to have mild elevation of her lipase and diagnosed with pancreatitis.  She comes in today as she does have a primary care doctor, and she wants to get established. She has a history of chronic back pain, with an MRI as far as 2012. She was told at one point that she might need surgery, however she wants to avoid that at all costs.  She also complains of being depressed, because of her chronic back pain, and very anxious at times. She is currently smoking, and is thinking about quitting. She endorses epigastric pain right upper quadrant pain, but sometimes gets worse with food.  ROS. Patient has no headache, no chest pain, no abdominal pain, no Nausea, no new weakness tingling or numbness, no cough or shortness of breath SOB.   Objective:    Filed Vitals:   11/30/12 1717  BP: 114/78  Pulse: 80  Temp: 98.7 F (37.1 C)  Resp: 16  Weight: 215 lb 12.8 oz (97.886 kg)  SpO2: 99%     Exam  GEN: Awake Alert, Oriented X 3,  EYES: PERRL, EOMI, no scleral icterus Head: NCAT Neck: Supple Neck,No JVD, No cervical lymphadenopathy appriciated.  Lungs: Symmetrical Chest wall movement, Good air movement bilaterally, CTAB CV: RRR,No Gallops,Rubs or new Murmurs, No Parasternal Heave Abdomen: +ve B.Sounds, Abd Soft, Non tender, No organomegaly appriciated, No rebound - guarding or rigidity. RQU pain on palpation MSK: no peripheral edema Neuro: No focal deficits Skin: no rashes Psych: Normal affect  Data Review   CBC No results found for this basename: WBC, HGB, HCT, PLT, MCV, MCH, MCHC, RDW, NEUTRABS, LYMPHSABS, MONOABS, EOSABS, BASOSABS, BANDABS, BANDSABD,  in the last 168  hours  Chemistries  No results found for this basename: NA, K, CL, CO2, GLUCOSE, BUN, CREATININE, GFRCGP, CALCIUM, MG, AST, ALT, ALKPHOS, BILITOT,  in the last 168 hours ------------------------------------------------------------------------------------------------------------------ No results found for this basename: HGBA1C,  in the last 72 hours ------------------------------------------------------------------------------------------------------------------ No results found for this basename: CHOL, HDL, LDLCALC, TRIG, CHOLHDL, LDLDIRECT,  in the last 72 hours ------------------------------------------------------------------------------------------------------------------ No results found for this basename: TSH, T4TOTAL, FREET3, T3FREE, THYROIDAB,  in the last 72 hours ------------------------------------------------------------------------------------------------------------------ No results found for this basename: VITAMINB12, FOLATE, FERRITIN, TIBC, IRON, RETICCTPCT,  in the last 72 hours  Coagulation profile  No results found for this basename: INR, PROTIME,  in the last 168 hours  Prior to Admission medications   Medication Sig Start Date End Date Taking? Authorizing Provider  hydrochlorothiazide (HYDRODIURIL) 25 MG tablet Take 1 tablet (25 mg total) by mouth daily. 11/30/12  Yes Costin Gherghe, MD  ondansetron (ZOFRAN ODT) 4 MG disintegrating tablet Take 1 tablet (4 mg total) by mouth every 8 (eight) hours as needed for nausea. 10/25/12  Yes Garlon Hatchet, PA-C  albuterol (PROVENTIL HFA;VENTOLIN HFA) 108 (90 BASE) MCG/ACT inhaler Inhale 2 puffs into the lungs every 4 (four) hours as needed for wheezing. 07/02/12   Suzi Roots, MD  Multiple Vitamin (MULTIVITAMIN WITH MINERALS) TABS Take 1 tablet by mouth daily.    Historical Provider, MD  nicotine ([employer] NICOTINE TRANSDERMAL SYS) 14 mg/24hr patch Place 1 patch onto the skin daily. 11/30/12  Pamella Pert, MD  oxyCODONE (OXY  IR/ROXICODONE) 5 MG immediate release tablet Take 1 tablet (5 mg total) by mouth every 4 (four) hours as needed for pain. 10/25/12   Garlon Hatchet, PA-C  sertraline (ZOLOFT) 50 MG tablet Take 1 tablet (50 mg total) by mouth daily. 11/30/12   Pamella Pert, MD  tizanidine (ZANAFLEX) 2 MG capsule Take 1 capsule (2 mg total) by mouth 3 (three) times daily as needed for muscle spasms. 11/30/12   Pamella Pert, MD     Assessment & Plan   Epigastric pain - Improved today, she did have elevated lipase. She has right upper quadrant pain on exam. - We've is here for gallstones with right upper quadrant ultrasound - Check lipid panel for triglycerides.  Back pain - Muscle relaxers - counseled for back exercises  HTN - refilled her medication  Depression with anxiety - started Zoloft  Tobacco abuse - nicotine patches  Healthcare maintenance - mammogram, colonoscopy ordered today - HBA1C     Pamella Pert M.D on 11/30/2012 at 6:04 PM

## 2012-11-30 NOTE — Telephone Encounter (Signed)
Patient needs [place] scheduled of RUQ of abdomen to rule out gallstones. Appointment not made due to after hours of Imaging at [facility].

## 2012-11-30 NOTE — Progress Notes (Signed)
Patient here to establish care Recently was seen in the ED for pancreatitis Has slipped disc and suffers from chronic back pain

## 2012-12-01 ENCOUNTER — Telehealth: Payer: Self-pay | Admitting: *Deleted

## 2012-12-01 LAB — HEMOGLOBIN A1C: Mean Plasma Glucose: 123 mg/dL — ABNORMAL HIGH (ref <117)

## 2012-12-01 LAB — LIPID PANEL
Cholesterol: 211 mg/dL — ABNORMAL HIGH (ref 0–200)
Triglycerides: 155 mg/dL — ABNORMAL HIGH (ref <150)

## 2012-12-01 NOTE — Telephone Encounter (Signed)
12/01/12 Unable to reach patient  Message left that Ultrasound is schedule for 12/02/12  At  11:00 a.m. At [facility] . Spoke with Lyla Son in radiology at [phone]  P.[facility] BSN [facility]

## 2012-12-02 ENCOUNTER — Ambulatory Visit (HOSPITAL_COMMUNITY)
Admission: RE | Admit: 2012-12-02 | Discharge: 2012-12-02 | Disposition: A | Discharge status: Home / Self Care | Payer: Self-pay | Source: Ambulatory Visit | Attending: Internal Medicine | Admitting: Internal Medicine

## 2012-12-02 DIAGNOSIS — R1011 Right upper quadrant pain: Secondary | ICD-10-CM

## 2012-12-02 NOTE — Telephone Encounter (Signed)
12/01/12 Spoke with patient and appoinment was schedule. P.Tunisia Landgrebe,RN BSN [facility]

## 2012-12-03 ENCOUNTER — Ambulatory Visit (HOSPITAL_COMMUNITY)
Admission: RE | Admit: 2012-12-03 | Discharge: 2012-12-03 | Disposition: A | Discharge status: Home / Self Care | Payer: Self-pay | Source: Ambulatory Visit | Attending: Internal Medicine | Admitting: Internal Medicine

## 2012-12-03 ENCOUNTER — Other Ambulatory Visit: Payer: Self-pay | Admitting: Internal Medicine

## 2012-12-03 DIAGNOSIS — R1011 Right upper quadrant pain: Secondary | ICD-10-CM

## 2012-12-03 DIAGNOSIS — K859 Acute pancreatitis without necrosis or infection, unspecified: Secondary | ICD-10-CM | POA: Insufficient documentation

## 2012-12-15 ENCOUNTER — Ambulatory Visit (HOSPITAL_COMMUNITY): Payer: Self-pay

## 2012-12-16 ENCOUNTER — Ambulatory Visit (HOSPITAL_COMMUNITY)
Admission: RE | Admit: 2012-12-16 | Discharge: 2012-12-16 | Disposition: A | Discharge status: Home / Self Care | Payer: Self-pay | Source: Ambulatory Visit | Attending: Internal Medicine | Admitting: Internal Medicine

## 2012-12-16 DIAGNOSIS — Z0000 Encounter for general adult medical examination without abnormal findings: Secondary | ICD-10-CM

## 2012-12-16 DIAGNOSIS — Z1231 Encounter for screening mammogram for malignant neoplasm of breast: Secondary | ICD-10-CM | POA: Insufficient documentation

## 2012-12-21 ENCOUNTER — Other Ambulatory Visit: Payer: Self-pay | Admitting: Internal Medicine

## 2012-12-21 ENCOUNTER — Telehealth: Payer: Self-pay | Admitting: Family Medicine

## 2012-12-21 DIAGNOSIS — R928 Other abnormal and inconclusive findings on diagnostic imaging of breast: Secondary | ICD-10-CM

## 2012-12-21 NOTE — Telephone Encounter (Signed)
Pt called about appt for colonoscopy

## 2012-12-22 NOTE — Telephone Encounter (Signed)
Unable to leave a message.

## 2012-12-26 ENCOUNTER — Emergency Department (HOSPITAL_COMMUNITY): Payer: No Typology Code available for payment source

## 2012-12-26 ENCOUNTER — Encounter (HOSPITAL_COMMUNITY): Payer: Self-pay | Admitting: Emergency Medicine

## 2012-12-26 ENCOUNTER — Emergency Department (HOSPITAL_COMMUNITY)
Admission: EM | Admit: 2012-12-26 | Discharge: 2012-12-26 | Disposition: A | Discharge status: Home / Self Care | Payer: No Typology Code available for payment source | Attending: Emergency Medicine | Admitting: Emergency Medicine

## 2012-12-26 DIAGNOSIS — Z79899 Other long term (current) drug therapy: Secondary | ICD-10-CM | POA: Insufficient documentation

## 2012-12-26 DIAGNOSIS — Z8709 Personal history of other diseases of the respiratory system: Secondary | ICD-10-CM | POA: Insufficient documentation

## 2012-12-26 DIAGNOSIS — X500XXA Overexertion from strenuous movement or load, initial encounter: Secondary | ICD-10-CM | POA: Insufficient documentation

## 2012-12-26 DIAGNOSIS — F17200 Nicotine dependence, unspecified, uncomplicated: Secondary | ICD-10-CM | POA: Insufficient documentation

## 2012-12-26 DIAGNOSIS — R296 Repeated falls: Secondary | ICD-10-CM | POA: Insufficient documentation

## 2012-12-26 DIAGNOSIS — Y939 Activity, unspecified: Secondary | ICD-10-CM | POA: Insufficient documentation

## 2012-12-26 DIAGNOSIS — I10 Essential (primary) hypertension: Secondary | ICD-10-CM | POA: Insufficient documentation

## 2012-12-26 DIAGNOSIS — M25461 Effusion, right knee: Secondary | ICD-10-CM

## 2012-12-26 DIAGNOSIS — S8990XA Unspecified injury of unspecified lower leg, initial encounter: Secondary | ICD-10-CM | POA: Insufficient documentation

## 2012-12-26 DIAGNOSIS — M25561 Pain in right knee: Secondary | ICD-10-CM

## 2012-12-26 DIAGNOSIS — Y92009 Unspecified place in unspecified non-institutional (private) residence as the place of occurrence of the external cause: Secondary | ICD-10-CM | POA: Insufficient documentation

## 2012-12-26 DIAGNOSIS — Z8719 Personal history of other diseases of the digestive system: Secondary | ICD-10-CM | POA: Insufficient documentation

## 2012-12-26 MED ORDER — IBUPROFEN 800 MG PO TABS
800.0000 mg | ORAL_TABLET | Freq: Once | ORAL | Status: AC
  Administered 2012-12-26: 800 mg via ORAL
  Filled 2012-12-26: qty 1

## 2012-12-26 MED ORDER — HYDROCODONE-ACETAMINOPHEN 5-325 MG PO TABS
1.0000 | ORAL_TABLET | Freq: Once | ORAL | Status: AC
  Administered 2012-12-26: 1 via ORAL
  Filled 2012-12-26: qty 1

## 2012-12-26 MED ORDER — NAPROXEN 500 MG PO TABS: 500.0000 mg | ORAL_TABLET | Freq: Two times a day (BID) | ORAL | Status: DC

## 2012-12-26 MED ORDER — HYDROCODONE-ACETAMINOPHEN 5-325 MG PO TABS: 1.0000 | ORAL_TABLET | Freq: Four times a day (QID) | ORAL | Status: DC | PRN

## 2012-12-26 NOTE — ED Notes (Signed)
Pt states that she has been having pain in her right knee x 1 wk.  States that she fell yesterday on her bathroom floor and heard a pop.  States that prior to that, it was like a "dull toothache".

## 2012-12-26 NOTE — ED Provider Notes (Signed)
History  This chart was scribed for non-physician practitioner working with Gerhard Munch, MD by Greggory Stallion, ED scribe. This patient was seen in room WTR8/WTR8 and the patient's care was started at 3:05 PM.  CSN: [id number] Arrival date & time 12/26/12  1238   Chief Complaint  Patient presents with  . Knee Pain   The history is provided by the patient and medical records. No language interpreter was used.    HPI Comments: Melanie Guzman is a 57 y.o. female who presents to the Emergency Department complaining of gradual onset, constant dull right knee pain with associated right knee swelling and right leg pain that started 1 week ago. She states she had some swelling in her right leg but it is resolved now. Pt states that she fell on her bathroom floor yesterday and heard a pop in her right knee. She states bearing weight on her right leg or lifting it up makes the pain worse. Pt denies any other associated symptoms. Pt states she has had problems with fluid in her right knee in the past. Pt states she does not have a PCP.   Past Medical History  Diagnosis Date  . Hypertension   . Chronic bronchitis    Past Surgical History  Procedure Laterality Date  . Abdominal hysterectomy      partial   No family history on file. History  Substance Use Topics  . Smoking status: Current Every Day Smoker -- 1.00 packs/day  . Smokeless tobacco: Not on file  . Alcohol Use: Yes     Comment: occasionally   OB History   Grav Para Term Preterm Abortions TAB SAB Ect Mult Living                 Review of Systems  Constitutional: Negative for fever and chills.  HENT: Negative for neck pain and neck stiffness.   Gastrointestinal: Negative for nausea and vomiting.  Musculoskeletal: Positive for joint swelling, arthralgias and gait problem (2/2 pain, but pt is using a cane without difficulty). Negative for back pain.  Skin: Negative for wound.  Allergic/Immunologic: Negative for  immunocompromised state.  Neurological: Negative for weakness and numbness.  Hematological: Does not bruise/bleed easily.  Psychiatric/Behavioral: The patient is not nervous/anxious.   All other systems reviewed and are negative.    Allergies  Review of patient's allergies indicates no known allergies.  Home Medications   Current Outpatient Rx  Name  Route  Sig  Dispense  Refill  . vitamin B-12 (CYANOCOBALAMIN) 1000 MCG tablet   Oral   Take 1,000 mcg by mouth daily.         . vitamin E 400 UNIT capsule   Oral   Take 400 Units by mouth daily.         . hydrochlorothiazide (HYDRODIURIL) 25 MG tablet   Oral   Take 1 tablet (25 mg total) by mouth daily.   30 tablet   3   . HYDROcodone-acetaminophen (NORCO/VICODIN) 5-325 MG per tablet   Oral   Take 1 tablet by mouth every 6 (six) hours as needed for pain.   15 tablet   0   . naproxen (NAPROSYN) 500 MG tablet   Oral   Take 1 tablet (500 mg total) by mouth 2 (two) times daily with a meal.   30 tablet   0   . nicotine ([employer] NICOTINE TRANSDERMAL SYS) 14 mg/24hr patch   Transdermal   Place 1 patch onto the skin daily.   28 patch  0   . sertraline (ZOLOFT) 50 MG tablet   Oral   Take 1 tablet (50 mg total) by mouth daily.   30 tablet   3   . tizanidine (ZANAFLEX) 2 MG capsule   Oral   Take 1 capsule (2 mg total) by mouth 3 (three) times daily as needed for muscle spasms.   30 capsule   1    BP 146/97  Pulse 73  Temp(Src) 98.6 F (37 C) (Oral)  Resp 18  SpO2 100%  Physical Exam  Nursing note and vitals reviewed. Constitutional: She appears well-developed and well-nourished. No distress.  HENT:  Head: Normocephalic and atraumatic.  Eyes: Conjunctivae are normal.  Neck: Normal range of motion.  Cardiovascular: Normal rate, regular rhythm, normal heart sounds and intact distal pulses.   No murmur heard. Capillary refill < 3  Pulmonary/Chest: Effort normal and breath sounds normal. No respiratory  distress. She has no wheezes.  Musculoskeletal: She exhibits tenderness. She exhibits no edema.       Right knee: She exhibits swelling (mild) and effusion (small). She exhibits normal range of motion, no ecchymosis, no deformity, no laceration, no erythema, no LCL laxity, normal patellar mobility and no MCL laxity. Tenderness found. Lateral joint line (mild) tenderness noted. No medial joint line tenderness noted.  ROM: Full range of motion of right knee with pain including flexion and extension.  No palpable defect the patellar tendon  Neurological: She is alert. Coordination normal.  Sensation intact Strength 5/5 in bilateral lower extremities.   Skin: Skin is warm and dry. No rash noted. She is not diaphoretic. No erythema.  No tenting of the skin  Psychiatric: She has a normal mood and affect.    ED Course  Procedures (including critical care time)  DIAGNOSTIC STUDIES: Oxygen Saturation is 100% on RA, normal by my interpretation.    COORDINATION OF CARE: 3:31 PM-Discussed treatment plan which includes continuing to use her knee brace and Naproxen and ibuprofen with pt at bedside and pt agreed to plan. Advised pt to follow up with an orthopaedist.  Labs Reviewed - No data to display Dg Knee Complete 4 Views Right  12/26/2012   *RADIOLOGY REPORT*  Clinical Data: Knee pain  RIGHT KNEE - COMPLETE 4+ VIEW  Comparison: None  Findings: Moderate degenerative change in the lateral joint compartment with joint space narrowing and spurring.  Mild spurring medially and the patella.  Small joint effusion  Negative for fracture.  IMPRESSION: Moderate degenerative change.  No acute abnormality.   Original Report Authenticated By: Janeece Riggers, M.D.   1. Knee pain, acute, right   2. Knee effusion, right     MDM  Melanie Guzman presents with acute on chronic knee pain after twisting it yesterday.  Patient X-Ray negative for obvious fracture or dislocation. I personally reviewed the imaging tests  through PACS system.  I reviewed available ER/hospitalization records through the EMR.   Pain managed in ED. Pt with mild swelling to the joint spaces, knee swelling, tightness in the knee, but without restricted range of motion. Pt is able to perform full flexion and extension of the knee.  Pt is without systemic symptoms, erythema or redness of the joint consistent with gout or septic joint.  Pain managed in ED. Pt advised to follow up with orthopedics if symptoms persist for possibility of missed fracture diagnosis and further evaluation and management of her chronic knee pain. Patient with brace in place on arrival in the ED, conservative therapy recommended  and discussed. Patient will be dc home & is agreeable with above plan.   I personally performed the services described in this documentation, which was scribed in my presence. The recorded information has been reviewed and is accurate.   Dahlia Client Abdoul Encinas, PA-C 12/26/12 1552

## 2012-12-26 NOTE — ED Notes (Signed)
Pt refused vitals because she needs to catch the bus

## 2012-12-27 NOTE — ED Provider Notes (Signed)
  Medical screening examination/treatment/procedure(s) were performed by non-physician practitioner and as supervising physician I was immediately available for consultation/collaboration.    Gerhard Munch, MD 12/27/12 0010

## 2012-12-30 ENCOUNTER — Emergency Department (HOSPITAL_COMMUNITY)
Admission: EM | Admit: 2012-12-30 | Discharge: 2012-12-30 | Disposition: A | Discharge status: Home / Self Care | Payer: No Typology Code available for payment source | Attending: Emergency Medicine | Admitting: Emergency Medicine

## 2012-12-30 ENCOUNTER — Emergency Department (HOSPITAL_COMMUNITY): Payer: No Typology Code available for payment source

## 2012-12-30 ENCOUNTER — Ambulatory Visit: Payer: Self-pay

## 2012-12-30 ENCOUNTER — Encounter (HOSPITAL_COMMUNITY): Payer: Self-pay

## 2012-12-30 DIAGNOSIS — I10 Essential (primary) hypertension: Secondary | ICD-10-CM | POA: Insufficient documentation

## 2012-12-30 DIAGNOSIS — Z8709 Personal history of other diseases of the respiratory system: Secondary | ICD-10-CM | POA: Insufficient documentation

## 2012-12-30 DIAGNOSIS — S0003XA Contusion of scalp, initial encounter: Secondary | ICD-10-CM | POA: Insufficient documentation

## 2012-12-30 DIAGNOSIS — F17200 Nicotine dependence, unspecified, uncomplicated: Secondary | ICD-10-CM | POA: Insufficient documentation

## 2012-12-30 DIAGNOSIS — S0083XA Contusion of other part of head, initial encounter: Secondary | ICD-10-CM

## 2012-12-30 DIAGNOSIS — Z8719 Personal history of other diseases of the digestive system: Secondary | ICD-10-CM | POA: Insufficient documentation

## 2012-12-30 HISTORY — DX: Acute pancreatitis without necrosis or infection, unspecified: K85.90

## 2012-12-30 MED ORDER — IBUPROFEN 800 MG PO TABS
800.0000 mg | ORAL_TABLET | Freq: Once | ORAL | Status: AC
  Administered 2012-12-30: 800 mg via ORAL
  Filled 2012-12-30: qty 1

## 2012-12-30 NOTE — ED Notes (Signed)
Pt escorted to discharge window. Verbalized understanding discharge instructions. In no acute distress.  Pt refused discharge vitals.

## 2012-12-30 NOTE — Progress Notes (Signed)
[facility] has seen patient and provided her with a [employer] application. Tried to explain the application to patient, was not very cooperative.
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
## 2012-12-30 NOTE — ED Notes (Signed)
MD at bedside. 
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
## 2013-01-26 ENCOUNTER — Telehealth (HOSPITAL_COMMUNITY): Payer: Self-pay | Admitting: *Deleted

## 2013-01-26 NOTE — Telephone Encounter (Signed)
Telephoned patient at home # and left message to return call to [facility] 

## 2013-01-31 ENCOUNTER — Emergency Department (HOSPITAL_COMMUNITY)
Admission: EM | Admit: 2013-01-31 | Discharge: 2013-01-31 | Disposition: A | Discharge status: Home / Self Care | Payer: Self-pay | Attending: Emergency Medicine | Admitting: Emergency Medicine

## 2013-01-31 ENCOUNTER — Encounter (HOSPITAL_COMMUNITY): Payer: Self-pay | Admitting: Emergency Medicine

## 2013-01-31 ENCOUNTER — Emergency Department (HOSPITAL_COMMUNITY): Payer: Self-pay

## 2013-01-31 DIAGNOSIS — F17200 Nicotine dependence, unspecified, uncomplicated: Secondary | ICD-10-CM | POA: Insufficient documentation

## 2013-01-31 DIAGNOSIS — J40 Bronchitis, not specified as acute or chronic: Secondary | ICD-10-CM

## 2013-01-31 DIAGNOSIS — R059 Cough, unspecified: Secondary | ICD-10-CM | POA: Insufficient documentation

## 2013-01-31 DIAGNOSIS — R05 Cough: Secondary | ICD-10-CM | POA: Insufficient documentation

## 2013-01-31 DIAGNOSIS — Z720 Tobacco use: Secondary | ICD-10-CM

## 2013-01-31 DIAGNOSIS — R509 Fever, unspecified: Secondary | ICD-10-CM | POA: Insufficient documentation

## 2013-01-31 DIAGNOSIS — I10 Essential (primary) hypertension: Secondary | ICD-10-CM | POA: Insufficient documentation

## 2013-01-31 DIAGNOSIS — J438 Other emphysema: Secondary | ICD-10-CM | POA: Insufficient documentation

## 2013-01-31 DIAGNOSIS — Z8719 Personal history of other diseases of the digestive system: Secondary | ICD-10-CM | POA: Insufficient documentation

## 2013-01-31 DIAGNOSIS — J439 Emphysema, unspecified: Secondary | ICD-10-CM

## 2013-01-31 DIAGNOSIS — Z79899 Other long term (current) drug therapy: Secondary | ICD-10-CM | POA: Insufficient documentation

## 2013-01-31 DIAGNOSIS — J42 Unspecified chronic bronchitis: Secondary | ICD-10-CM | POA: Insufficient documentation

## 2013-01-31 MED ORDER — FLUTICASONE PROPIONATE 50 MCG/ACT NA SUSP
2.0000 | Freq: Once | NASAL | Status: DC
  Filled 2013-01-31: qty 16

## 2013-01-31 MED ORDER — AZITHROMYCIN 250 MG PO TABS: 250.0000 mg | ORAL_TABLET | Freq: Every day | ORAL | Status: DC

## 2013-01-31 MED ORDER — GUAIFENESIN 100 MG/5ML PO LIQD: 100.0000 mg | ORAL | Status: DC | PRN

## 2013-01-31 MED ORDER — GUAIFENESIN 100 MG/5ML PO SYRP
200.0000 mg | ORAL_SOLUTION | Freq: Once | ORAL | Status: DC
  Filled 2013-01-31: qty 10

## 2013-01-31 MED ORDER — ALBUTEROL SULFATE HFA 108 (90 BASE) MCG/ACT IN AERS
2.0000 | INHALATION_SPRAY | Freq: Once | RESPIRATORY_TRACT | Status: AC
  Administered 2013-01-31: 2 via RESPIRATORY_TRACT
  Filled 2013-01-31: qty 6.7

## 2013-01-31 NOTE — ED Notes (Signed)
Pt c/o sinus problems x 1 week, has been taking OTC meds.
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
## 2013-01-31 NOTE — ED Notes (Signed)
Unable to discharge pt at this time, still waiting for medication to come up from pharmacy.

## 2013-01-31 NOTE — ED Provider Notes (Signed)
Medical screening examination/treatment/procedure(s) were performed by non-physician practitioner and as supervising physician I was immediately available for consultation/collaboration.    Mirka Barbone R Hasson Gaspard, MD 01/31/13 1335 

## 2013-02-21 ENCOUNTER — Other Ambulatory Visit: Payer: Self-pay | Admitting: Obstetrics and Gynecology

## 2013-02-21 DIAGNOSIS — R928 Other abnormal and inconclusive findings on diagnostic imaging of breast: Secondary | ICD-10-CM

## 2013-02-22 ENCOUNTER — Encounter (HOSPITAL_COMMUNITY): Payer: Self-pay

## 2013-02-22 ENCOUNTER — Ambulatory Visit (HOSPITAL_COMMUNITY)
Admission: RE | Admit: 2013-02-22 | Discharge: 2013-02-22 | Disposition: A | Discharge status: Home / Self Care | Payer: No Typology Code available for payment source | Source: Ambulatory Visit | Attending: Obstetrics and Gynecology | Admitting: Obstetrics and Gynecology

## 2013-02-22 VITALS — BP 102/64 | Temp 98.7°F | Ht 68.0 in | Wt 209.4 lb

## 2013-02-22 DIAGNOSIS — Z1239 Encounter for other screening for malignant neoplasm of breast: Secondary | ICD-10-CM

## 2013-02-22 NOTE — Progress Notes (Signed)
Patient referred to [facility] by the [facility] due to needing additional imaging of the left breast. Screening mammogram completed 12/16/2012 at [facility].  Pap Smear:    Pap smear not completed today. Last Pap smear was around 3 years ago at the [facility] and normal per patient. Per patient has no history of an abnormal Pap smear. Patient has a history of a hysterectomy in 2005 for fibroids. Patient does not need any further Pap smears due to her history of a hysterectomy for benign reasons per ACOG and [facility] guidelines. No Pap smear results in EPIC.  Physical exam: Breasts Breasts symmetrical. No skin abnormalities bilateral breasts. No nipple retraction bilateral breasts. No nipple discharge bilateral breasts. No lymphadenopathy. No lumps palpated bilateral breasts. No complaints of pain or tenderness on exam. Referred patient to the [facility] for left breast diagnostic mammogram per recommendation. Appointment scheduled for Tuesday, March 01, 2013 at 1315.      Pelvic/Bimanual No Pap smear completed today since patient has a history of a hysterectomy for benign reasons. Pap smear not indicated per [facility] guidelines.

## 2013-02-22 NOTE — Patient Instructions (Signed)
Taught [name] how to perform BSE and gave educational materials to take home. Patient did not need a Pap smear today due to last Pap smear due to a history of a hysterectomy for benign reasons. Let patient know that she does not need any further Pap smears due to her history of a hysterectomy for benign reasons and no history of an abnormal Pap smear.  Referred patient to the [facility] for left breast diagnostic mammogram per recommendation. Appointment scheduled for Tuesday, March 01, 2013 at 1315. Patient aware of appointment and will be there.  Melanie Guzman verbalized understanding.  Demar Shad, Kathaleen Maser, RN 2:06 PM

## 2013-03-01 ENCOUNTER — Ambulatory Visit (HOSPITAL_COMMUNITY): Payer: No Typology Code available for payment source

## 2013-04-13 ENCOUNTER — Telehealth (HOSPITAL_COMMUNITY): Payer: Self-pay | Admitting: *Deleted

## 2013-04-13 NOTE — Telephone Encounter (Signed)
Telephoned patient at home # and left message to return call to [facility]. Patient needs to reschedule appointment with [facility].

## 2013-05-12 ENCOUNTER — Encounter (HOSPITAL_COMMUNITY): Payer: Self-pay | Admitting: Emergency Medicine

## 2013-05-12 ENCOUNTER — Emergency Department (HOSPITAL_COMMUNITY)
Admission: EM | Admit: 2013-05-12 | Discharge: 2013-05-12 | Disposition: A | Discharge status: Home / Self Care | Payer: No Typology Code available for payment source | Attending: Emergency Medicine | Admitting: Emergency Medicine

## 2013-05-12 DIAGNOSIS — Z8719 Personal history of other diseases of the digestive system: Secondary | ICD-10-CM | POA: Insufficient documentation

## 2013-05-12 DIAGNOSIS — K089 Disorder of teeth and supporting structures, unspecified: Secondary | ICD-10-CM | POA: Insufficient documentation

## 2013-05-12 DIAGNOSIS — K029 Dental caries, unspecified: Secondary | ICD-10-CM | POA: Insufficient documentation

## 2013-05-12 DIAGNOSIS — I10 Essential (primary) hypertension: Secondary | ICD-10-CM | POA: Insufficient documentation

## 2013-05-12 DIAGNOSIS — T398X5A Adverse effect of other nonopioid analgesics and antipyretics, not elsewhere classified, initial encounter: Secondary | ICD-10-CM | POA: Insufficient documentation

## 2013-05-12 DIAGNOSIS — K0889 Other specified disorders of teeth and supporting structures: Secondary | ICD-10-CM

## 2013-05-12 DIAGNOSIS — F17200 Nicotine dependence, unspecified, uncomplicated: Secondary | ICD-10-CM | POA: Insufficient documentation

## 2013-05-12 DIAGNOSIS — R110 Nausea: Secondary | ICD-10-CM | POA: Insufficient documentation

## 2013-05-12 DIAGNOSIS — Z8709 Personal history of other diseases of the respiratory system: Secondary | ICD-10-CM | POA: Insufficient documentation

## 2013-05-12 MED ORDER — ONDANSETRON HCL 4 MG PO TABS: 4.0000 mg | ORAL_TABLET | Freq: Four times a day (QID) | ORAL | Status: DC

## 2013-05-12 MED ORDER — HYDROCODONE-ACETAMINOPHEN 5-325 MG PO TABS
2.0000 | ORAL_TABLET | Freq: Once | ORAL | Status: AC
  Administered 2013-05-12: 2 via ORAL
  Filled 2013-05-12: qty 2

## 2013-05-12 MED ORDER — PENICILLIN V POTASSIUM 500 MG PO TABS: 500.0000 mg | ORAL_TABLET | Freq: Four times a day (QID) | ORAL | Status: AC

## 2013-05-12 MED ORDER — HYDROCODONE-ACETAMINOPHEN 5-325 MG PO TABS: 1.0000 | ORAL_TABLET | Freq: Four times a day (QID) | ORAL | Status: DC | PRN

## 2013-05-12 NOTE — ED Provider Notes (Signed)
CSN: [id number]     Arrival date & time 05/12/13  4098 History   First MD Initiated Contact with Patient 05/12/13 1014     Chief Complaint  Patient presents with  . Dental Pain   (Consider location/radiation/quality/duration/timing/severity/associated sxs/prior Treatment) The history is provided by the patient. No language interpreter was used.  Melanie Guzman  Is a 85 show female with past medical history of hypertension, chronic bronchitis, pancreatitis presenting to the emergency department with dental pain that has been progressively worse since Friday. Patient reported the dental pain is localized to the left mandibular region that is described as an aching sensation that is now turned into a constant pulsating sensation with radiation to her left ear. Patient reports that she was seen by her dentist on Tuesday as prescribed Tylenol #3-patient reports that she's been taking the Tylenol #3 negative relief, reports that the Tylenol No. 3 has been making her feel extremely nauseous. Patient reports that the pain has gotten worse, is constant, preventing the patient from being able to sleep. Patient reports that she's been having chills. Patient reports that the pain worsens with her tongue touching the left side of her mouth, chewing, talking. Denied fever, chest pain, shortness of breath, difficulty breathing, neck pain, vomiting, diarrhea, numbness, tingling, difficulty swallowing, sore throat. PCP none   Patient reported that she has an appointment with oral surgeon on 05/30/2013.   Past Medical History  Diagnosis Date  . Hypertension   . Chronic bronchitis   . Pancreatitis    Past Surgical History  Procedure Laterality Date  . Abdominal hysterectomy      partial   Family History  Problem Relation Age of Onset  . Heart attack Mother   . Stroke Maternal Grandmother   . Cancer Maternal Grandfather     bone   History  Substance Use Topics  . Smoking status: Current Every Day  Smoker -- 1.00 packs/day for 40 years    Types: Cigarettes  . Smokeless tobacco: Never Used  . Alcohol Use: No   OB History   Grav Para Term Preterm Abortions TAB SAB Ect Mult Living   2 2 2       2      Review of Systems  Constitutional: Positive for chills. Negative for fever.  HENT: Positive for dental problem. Negative for sore throat and trouble swallowing.   Eyes: Negative for visual disturbance.  Respiratory: Negative for chest tightness and shortness of breath.   Cardiovascular: Negative for chest pain.  Gastrointestinal: Positive for nausea. Negative for vomiting.  Neurological: Negative for dizziness and weakness.  All other systems reviewed and are negative.    Allergies  Review of patient's allergies indicates no known allergies.  Home Medications   Current Outpatient Rx  Name  Route  Sig  Dispense  Refill  . acetaminophen-codeine (TYLENOL #3) 300-30 MG per tablet   Oral   Take 1 tablet by mouth every 4 (four) hours as needed for moderate pain.         Marland Kitchen ibuprofen (ADVIL,MOTRIN) 200 MG tablet   Oral   Take 400 mg by mouth every 6 (six) hours as needed (dental pain).         Marland Kitchen HYDROcodone-acetaminophen (NORCO/VICODIN) 5-325 MG per tablet   Oral   Take 1 tablet by mouth every 6 (six) hours as needed.   15 tablet   0   . ondansetron (ZOFRAN) 4 MG tablet   Oral   Take 1 tablet (4 mg total)  by mouth every 6 (six) hours.   12 tablet   0   . penicillin v potassium (VEETID) 500 MG tablet   Oral   Take 1 tablet (500 mg total) by mouth 4 (four) times daily.   40 tablet   0    BP 168/95  Pulse 79  Temp(Src) 99.5 F (37.5 C) (Oral)  Resp 20  SpO2 100% Physical Exam  Nursing note and vitals reviewed. Constitutional: She is oriented to person, place, and time. She appears well-developed and well-nourished. No distress.  HENT:  Head: Normocephalic.  Mouth/Throat: Oropharynx is clear and moist. She does not have dentures. No trismus in the jaw.  Abnormal dentition. Dental caries present. No uvula swelling or lacerations. No oropharyngeal exudate or tonsillar abscesses.    Left mandibular swelling noted. Discomfort upon palpation to left mandible. Poor dentition identified-numerous teeth missing, most the remaining teeth in decaying process. Second molar of left mandibular jawline in decaying process along with third molar in decaying process and diagrammed. Discomfort upon palpation to third and second molar of left mandibular jawline with tongue depressor. Left maxillary second molar in decaying process and slightly diagrammed. Uvula midline, symmetrical elevation. Negative sublingual lesions. Negative trismus. Negative signs of abscess or cyst formation noted. Negative oral lesions identified.  Eyes: Conjunctivae and EOM are normal. Pupils are equal, round, and reactive to light. Right eye exhibits no discharge. Left eye exhibits no discharge.  Neck: Normal range of motion. Neck supple.  Negative neck stiffness Negative nuchal rigidity Negative cervical lymphadenopathy  Cardiovascular: Normal rate, regular rhythm and normal heart sounds.  Exam reveals no friction rub.   No murmur heard. Pulses:      Radial pulses are 2+ on the right side, and 2+ on the left side.  Pulmonary/Chest: Effort normal and breath sounds normal. No respiratory distress. She has no wheezes. She has no rales.  Musculoskeletal: Normal range of motion.  Lymphadenopathy:    She has no cervical adenopathy.  Neurological: She is alert and oriented to person, place, and time. No cranial nerve deficit. She exhibits normal muscle tone. Coordination normal.  Cranial nerves III through XII grossly intact  Skin: Skin is warm and dry. No rash noted. She is not diaphoretic. No erythema.  Psychiatric: She has a normal mood and affect. Her behavior is normal. Thought content normal.    ED Course  Procedures (including critical care time) Labs Review Labs Reviewed - No  data to display Imaging Review No results found.  EKG Interpretation   None       MDM   1. Pain, dental    Medications  HYDROcodone-acetaminophen (NORCO/VICODIN) 5-325 MG per tablet 2 tablet (2 tablets Oral Given 05/12/13 1053)   Filed Vitals:   05/12/13 1008  BP: 168/95  Pulse: 79  Temp: 99.5 F (37.5 C)  TempSrc: Oral  Resp: 20  SpO2: 100%   Patient presenting to the ED with dental pain that has been ongoing for th past couple of weeks with worsening over the past week, since Friday. Patient reported that she was seen by a physician where she was prescribed Tylenol #3 that has not been working for her and has been making her feel nauseous.  Alert and oriented. GCS 15. Facial swelling noted to the left mandibular region, mild, with pain upon palpation. Poor dentition noted with left mandibular third molar decaying process and second molar diagrammed and decaying with pain upon palpation with tongue depressor. Negative abscess of cyst formation noted superficially.  Negative trismus, negative sublingual lesions. Uvula midline. Cranial nerves grossly intact. Heart rate and rhythm normal. Lungs clear to auscultation bilaterally. Pulses palpable, radial 2+. Patient stable, afebrile. Suspicion to be dental pain secondary to poor dentition - possible beginnings of periapical abscess to left mandibular region. Discharged patient. Discharged patient with antibiotics and pain medications - discussed with patient course, precautions, disposal technique. Discussed with patient to stop taking Tylenol #3 since they are causing nausea. Referred patient to oral surgeon and to keep appointment with oral surgeon on 05/30/2013. Discussed with patient to rest and stay hydrated. Discussed with patient to continue to apply warm compressions to site.discussed diet with patient. Dental referral sheet given to patient in discharge paperwork. Discussed with patient to continue to monitor symptoms and if symptoms  are to worsen or change - educated patient on what to watch out for - to report back to the ED - strict return instructions given.  Patient agreed to plan of care, understood, all questions answered.     Raymon Mutton, PA-C 05/12/13 1129

## 2013-05-12 NOTE — ED Provider Notes (Signed)
Medical screening examination/treatment/procedure(s) were performed by non-physician practitioner and as supervising physician I was immediately available for consultation/collaboration.  Flint Melter, MD 05/12/13 [phone]

## 2013-05-12 NOTE — ED Notes (Signed)
Pt from home with L lower tooth pain. Pt has seen dentist and was tx for abscess with pcn and vicodin which helped. Pt was supposed to see oral surgeon, but has not yet d/t financial issues. Pt is A&O and in NAD

## 2013-05-27 ENCOUNTER — Telehealth (HOSPITAL_COMMUNITY): Payer: Self-pay | Admitting: *Deleted

## 2013-05-27 NOTE — Telephone Encounter (Signed)
Patient returned call. Advised patient to call [facility] to reschedule diagnostic mammogram. Patient voiced understanding.

## 2013-05-27 NOTE — Telephone Encounter (Signed)
Telephoned patient at home # and left message to return call to [facility] 

## 2013-07-12 ENCOUNTER — Ambulatory Visit
Admission: RE | Admit: 2013-07-12 | Discharge: 2013-07-12 | Disposition: A | Discharge status: Home / Self Care | Payer: No Typology Code available for payment source | Source: Ambulatory Visit | Attending: Obstetrics and Gynecology | Admitting: Obstetrics and Gynecology

## 2013-07-12 DIAGNOSIS — R928 Other abnormal and inconclusive findings on diagnostic imaging of breast: Secondary | ICD-10-CM

## 2013-09-25 ENCOUNTER — Emergency Department (HOSPITAL_COMMUNITY)
Admission: EM | Admit: 2013-09-25 | Discharge: 2013-09-25 | Disposition: A | Discharge status: Home / Self Care | Payer: BC Managed Care – PPO | Attending: Emergency Medicine | Admitting: Emergency Medicine

## 2013-09-25 ENCOUNTER — Emergency Department (HOSPITAL_COMMUNITY): Payer: BC Managed Care – PPO

## 2013-09-25 DIAGNOSIS — R0789 Other chest pain: Secondary | ICD-10-CM | POA: Insufficient documentation

## 2013-09-25 DIAGNOSIS — F17200 Nicotine dependence, unspecified, uncomplicated: Secondary | ICD-10-CM | POA: Insufficient documentation

## 2013-09-25 DIAGNOSIS — J209 Acute bronchitis, unspecified: Secondary | ICD-10-CM | POA: Insufficient documentation

## 2013-09-25 DIAGNOSIS — M25512 Pain in left shoulder: Secondary | ICD-10-CM

## 2013-09-25 DIAGNOSIS — J40 Bronchitis, not specified as acute or chronic: Secondary | ICD-10-CM

## 2013-09-25 DIAGNOSIS — I10 Essential (primary) hypertension: Secondary | ICD-10-CM | POA: Insufficient documentation

## 2013-09-25 DIAGNOSIS — Z79899 Other long term (current) drug therapy: Secondary | ICD-10-CM | POA: Insufficient documentation

## 2013-09-25 LAB — BASIC METABOLIC PANEL
BUN: 9 mg/dL (ref 6–23)
CALCIUM: 10 mg/dL (ref 8.4–10.5)
CO2: 27 meq/L (ref 19–32)
CREATININE: 0.72 mg/dL (ref 0.50–1.10)
Chloride: 103 mEq/L (ref 96–112)
GFR calc Af Amer: >90 mL/min (ref >90)
GFR calc non Af Amer: >90 mL/min (ref >90)
GLUCOSE: 100 mg/dL — AB (ref 70–99)
Potassium: 4.5 mEq/L (ref 3.7–5.3)
SODIUM: 139 meq/L (ref 137–147)

## 2013-09-25 LAB — HEPATIC FUNCTION PANEL
ALBUMIN: 3.8 g/dL (ref 3.5–5.2)
ALK PHOS: 69 U/L (ref 39–117)
ALT: 46 U/L — ABNORMAL HIGH (ref 0–35)
AST: 37 U/L (ref 0–37)
BILIRUBIN TOTAL: 0.3 mg/dL (ref 0.3–1.2)
Bilirubin, Direct: <0.2 mg/dL (ref 0.0–0.3)
Total Protein: 7.5 g/dL (ref 6.0–8.3)

## 2013-09-25 LAB — URINALYSIS, ROUTINE W REFLEX MICROSCOPIC
Bilirubin Urine: NEGATIVE
Glucose, UA: NEGATIVE mg/dL
Hgb urine dipstick: NEGATIVE
Ketones, ur: NEGATIVE mg/dL
Leukocytes, UA: NEGATIVE
Nitrite: NEGATIVE
Protein, ur: NEGATIVE mg/dL
Specific Gravity, Urine: 1.021 (ref 1.005–1.030)
Urobilinogen, UA: 2 mg/dL — ABNORMAL HIGH (ref 0.0–1.0)
pH: 7 (ref 5.0–8.0)

## 2013-09-25 LAB — CBC
HEMATOCRIT: 40.6 % (ref 36.0–46.0)
HEMOGLOBIN: 14.2 g/dL (ref 12.0–15.0)
MCH: 30.1 pg (ref 26.0–34.0)
MCHC: 35 g/dL (ref 30.0–36.0)
MCV: 86.2 fL (ref 78.0–100.0)
Platelets: 177 10*3/uL (ref 150–400)
RBC: 4.71 MIL/uL (ref 3.87–5.11)
RDW: 13.1 % (ref 11.5–15.5)
WBC: 7.8 10*3/uL (ref 4.0–10.5)

## 2013-09-25 LAB — TROPONIN I: Troponin I: <0.3 ng/mL (ref <0.30)

## 2013-09-25 LAB — LIPASE, BLOOD: Lipase: 58 U/L (ref 11–59)

## 2013-09-25 MED ORDER — DIAZEPAM 5 MG PO TABS: 5.0000 mg | ORAL_TABLET | Freq: Once | ORAL | Status: DC

## 2013-09-25 MED ORDER — HYDROCODONE-ACETAMINOPHEN 5-325 MG PO TABS
1.0000 | ORAL_TABLET | Freq: Once | ORAL | Status: AC
  Administered 2013-09-25: 1 via ORAL
  Filled 2013-09-25: qty 1

## 2013-09-25 MED ORDER — AZITHROMYCIN 250 MG PO TABS: 250.0000 mg | ORAL_TABLET | Freq: Every day | ORAL | Status: DC

## 2013-09-25 NOTE — ED Provider Notes (Signed)
CSN: [id number]     Arrival date & time 09/25/13  1431 History   First MD Initiated Contact with Patient 09/25/13 1507     Chief Complaint  Patient presents with  . Back Pain     (Consider location/radiation/quality/duration/timing/severity/associated sxs/prior Treatment) The history is provided by the patient. No language interpreter was used.  Melanie Guzman is a 58 y/o F with PMHx of HTN, pancreatitis, chronic bronchitis presenting to the ED with left sided back pain that has been ongoing for the past 4-5 days. Patient reported that the pain started on Wednesday - reported that she went hiking the day before and was holding bags on her left side. Reported that when she woke up the next morning she noticed pain to the left scapula. Patient reported that the pain is described as a sharp constant pain with radiation to there chest. Reported that motion makes the pain worse, reported that staying sedentary without pain. Reported that she has been using Tylenol, warm compressions, epstein salt with minimal relief. Denies shortness of breath, difficulty breathing, difficulty swallowing, syncope, weakness, numbness, tingling, loss of sensation, fever, chills, sudden loss of vision, blurred vision, fall, injury, nausea, vomiting, diarrhea, abdominal pain. PCP [facility]   Past Medical History  Diagnosis Date  . Hypertension   . Chronic bronchitis   . Pancreatitis    Past Surgical History  Procedure Laterality Date  . Abdominal hysterectomy      partial   Family History  Problem Relation Age of Onset  . Heart attack Mother   . Stroke Maternal Grandmother   . Cancer Maternal Grandfather     bone   History  Substance Use Topics  . Smoking status: Current Every Day Smoker -- 1.00 packs/day for 40 years    Types: Cigarettes  . Smokeless tobacco: Never Used  . Alcohol Use: No   OB History   Grav Para Term Preterm Abortions TAB SAB Ect Mult Living   2 2 2       2      Review of  Systems  Constitutional: Negative for fever and chills.  Eyes: Negative for visual disturbance.  Respiratory: Negative for chest tightness.   Cardiovascular: Positive for chest pain.  Gastrointestinal: Negative for nausea, vomiting and abdominal pain.  Musculoskeletal: Positive for arthralgias (Left shoulder). Negative for neck pain.  Neurological: Negative for dizziness, syncope, weakness and numbness.  All other systems reviewed and are negative.     Allergies  Review of patient's allergies indicates no known allergies.  Home Medications   Current Outpatient Rx  Name  Route  Sig  Dispense  Refill  . acetaminophen (TYLENOL) 500 MG tablet   Oral   Take 1,000 mg by mouth every 6 (six) hours as needed for mild pain.         Marland Kitchen. albuterol (PROVENTIL HFA;VENTOLIN HFA) 108 (90 BASE) MCG/ACT inhaler   Inhalation   Inhale 1-2 puffs into the lungs every 6 (six) hours as needed for wheezing or shortness of breath.         Marland Kitchen. glucosamine-chondroitin 500-400 MG tablet   Oral   Take 1 tablet by mouth daily after breakfast.         . azithromycin (ZITHROMAX) 250 MG tablet   Oral   Take 1 tablet (250 mg total) by mouth daily. Take first 2 tablets together, then 1 every day until finished.   6 tablet   0    BP 155/101  Pulse 68  Temp(Src) 98.1 F (  36.7 C) (Oral)  Resp 20  SpO2 100% Physical Exam  Nursing note and vitals reviewed. Constitutional: She is oriented to person, place, and time. She appears well-developed and well-nourished. No distress.  HENT:  Head: Normocephalic and atraumatic.  Mouth/Throat: Oropharynx is clear and moist. No oropharyngeal exudate.  Eyes: Conjunctivae and EOM are normal. Pupils are equal, round, and reactive to light. Right eye exhibits no discharge. Left eye exhibits no discharge.  Neck: Normal range of motion. Neck supple. No tracheal deviation present.  Cardiovascular: Normal rate, regular rhythm and normal heart sounds.  Exam reveals no  friction rub.   No murmur heard. Pulses:      Radial pulses are 2+ on the right side, and 2+ on the left side.       Dorsalis pedis pulses are 2+ on the right side, and 2+ on the left side.  Pulmonary/Chest: Effort normal and breath sounds normal. No respiratory distress. She has no wheezes. She has no rales. She exhibits no tenderness.  Patient stable to speak in full sentences without difficulty Negative use of accessory muscles Negative stridor Negative pain upon palpation to chest wall Negative crepitus  Musculoskeletal: Normal range of motion. She exhibits tenderness.  Negative swelling, erythema, inflammation, lesions, sores, deformities noted to left shoulder. Negative discomfort upon palpation to the anterior posterior aspect of the glenohumeral joint. Negative tenting of the clavicle. Discomfort upon palpation to the left side of the trapezius muscle. Discomfort upon palpation to the edge of the left scapula and between the scapula and the mid spinal region. Negative masses palpated. Full range of motion to upper tremors bilaterally without difficulty or ataxia noted.  Lymphadenopathy:    She has no cervical adenopathy.  Neurological: She is alert and oriented to person, place, and time. No cranial nerve deficit. She exhibits normal muscle tone. Coordination normal.  Cranial nerves III-XII grossly intact Strength 5+/5+ to upper and lower extremities bilaterally with resistance applied, equal distribution noted Strength intact to MCP, PIP, DIP joints of left hand Sensation intact with differentiation to sharp and dull touch  Skin: Skin is warm and dry. No rash noted. She is not diaphoretic. No erythema.  Psychiatric: She has a normal mood and affect. Her behavior is normal. Thought content normal.    ED Course  Procedures (including critical care time)  Results for orders placed during the hospital encounter of 09/25/13  TROPONIN I      Result Value Ref Range   Troponin I <0.30   <0.30 ng/mL  CBC      Result Value Ref Range   WBC 7.8  4.0 - 10.5 K/uL   RBC 4.71  3.87 - 5.11 MIL/uL   Hemoglobin 14.2  12.0 - 15.0 g/dL   HCT 16.1  09.6 - 04.5 %   MCV 86.2  78.0 - 100.0 fL   MCH 30.1  26.0 - 34.0 pg   MCHC 35.0  30.0 - 36.0 g/dL   RDW 40.9  81.1 - 91.4 %   Platelets 177  150 - 400 K/uL  BASIC METABOLIC PANEL      Result Value Ref Range   Sodium 139  137 - 147 mEq/L   Potassium 4.5  3.7 - 5.3 mEq/L   Chloride 103  96 - 112 mEq/L   CO2 27  19 - 32 mEq/L   Glucose, Bld 100 (*) 70 - 99 mg/dL   BUN 9  6 - 23 mg/dL   Creatinine, Ser 7.82  0.50 - 1.10  mg/dL   Calcium 16.1  8.4 - 09.6 mg/dL   GFR calc non Af Amer >90  >90 mL/min   GFR calc Af Amer >90  >90 mL/min  HEPATIC FUNCTION PANEL      Result Value Ref Range   Total Protein 7.5  6.0 - 8.3 g/dL   Albumin 3.8  3.5 - 5.2 g/dL   AST 37  0 - 37 U/L   ALT 46 (*) 0 - 35 U/L   Alkaline Phosphatase 69  39 - 117 U/L   Total Bilirubin 0.3  0.3 - 1.2 mg/dL   Bilirubin, Direct <0.4  0.0 - 0.3 mg/dL   Indirect Bilirubin NOT CALCULATED  0.3 - 0.9 mg/dL  LIPASE, BLOOD      Result Value Ref Range   Lipase 58  11 - 59 U/L  URINALYSIS, ROUTINE W REFLEX MICROSCOPIC      Result Value Ref Range   Color, Urine YELLOW  YELLOW   APPearance CLEAR  CLEAR   Specific Gravity, Urine 1.021  1.005 - 1.030   pH 7.0  5.0 - 8.0   Glucose, UA NEGATIVE  NEGATIVE mg/dL   Hgb urine dipstick NEGATIVE  NEGATIVE   Bilirubin Urine NEGATIVE  NEGATIVE   Ketones, ur NEGATIVE  NEGATIVE mg/dL   Protein, ur NEGATIVE  NEGATIVE mg/dL   Urobilinogen, UA 2.0 (*) 0.0 - 1.0 mg/dL   Nitrite NEGATIVE  NEGATIVE   Leukocytes, UA NEGATIVE  NEGATIVE    Labs Review Labs Reviewed  BASIC METABOLIC PANEL - Abnormal; Notable for the following:    Glucose, Bld 100 (*)    All other components within normal limits  HEPATIC FUNCTION PANEL - Abnormal; Notable for the following:    ALT 46 (*)    All other components within normal limits  URINALYSIS, ROUTINE  W REFLEX MICROSCOPIC - Abnormal; Notable for the following:    Urobilinogen, UA 2.0 (*)    All other components within normal limits  TROPONIN I  CBC  LIPASE, BLOOD   Imaging Review Dg Chest 2 View  09/25/2013   CLINICAL DATA:  Back pain.  EXAM: CHEST  2 VIEW  COMPARISON:  DG CHEST 2 VIEW dated 01/31/2013; DG CHEST 2 VIEW dated 07/02/2012  FINDINGS: The heart size and mediastinal contours are stable. Interstitial prominence and central airway thickening appear chronic and unchanged. There is no airspace disease, edema or significant pleural effusion. A mild convex right thoracic scoliosis and mild spondylosis are noted. Telemetry leads overlie the chest.  IMPRESSION: Stable chronic lung disease with mild interstitial prominence and central airway thickening. No acute cardiopulmonary process identified.   Electronically Signed   By: Roxy Horseman M.D.   On: 09/25/2013 17:17   Dg Shoulder Right  09/25/2013   CLINICAL DATA:  Left shoulder pain for 5 days.  No known injury.  EXAM: RIGHT SHOULDER - 2+ VIEW  COMPARISON:  None.  FINDINGS: There are glenohumeral degenerative changes with joint space loss and osteophytes. Mild acromioclavicular degenerative changes are present. The subacromial space is preserved. There is no evidence of acute fracture or dislocation. Degenerative changes are noted within the cervical spine.  IMPRESSION: No acute osseous findings. Glenohumeral and acromioclavicular degenerative changes as described.   Electronically Signed   By: Roxy Horseman M.D.   On: 09/25/2013 17:18     EKG Interpretation   Date/Time:  Sunday September 25 2013 16:24:16 EDT Ventricular Rate:  57 PR Interval:  194 QRS Duration: 86 QT Interval:  426 QTC  Calculation: 415 R Axis:   77 Text Interpretation:  Sinus rhythm ST elevation, consider inferior injury  Poor data quality grossly unchanged since before  Reconfirmed by YAO  MD,  DAVID ([id number]) on 09/25/2013 6:11:19 PM      MDM   Final diagnoses:   Bronchitis  Left shoulder pain   Medications  HYDROcodone-acetaminophen (NORCO/VICODIN) 5-325 MG per tablet 1 tablet (1 tablet Oral Given 09/25/13 1606)   Filed Vitals:   09/25/13 1444 09/25/13 1753  BP: 155/91 155/101  Pulse: 68 68  Temp: 98.5 F (36.9 C) 98.1 F (36.7 C)  TempSrc: Oral Oral  Resp: 18 20  SpO2: 98% 100%    Patient presenting to the ED with left scapula pain that started abruptly 4-5 days ago - patient reported that the pain is sharp constant pain. Reported that pain radiates to the chest. Stated that she has been using Tylenol with warm compressions and epstein salt. Patient has history of pancreatitis and is due to follow up with [facility].  Alert and oriented. GCS 15. Heart rate and rhythm normal. Lungs good auscultation to upper and lower lobes bilaterally. Radial and DP pulses 2+ bilaterally. Negative pain upon palpation to the chest wall-negative crepitus-patient is become full sentences without difficulty. Negative deformities noted to the left shoulder-negative pain upon palpation to the anterior posterior aspect of the glenohumeral joint. Full range of motion to left shoulder identify without difficulty. Discomfort upon palpation left trapezius and base of left scapula. Strength intact with equal distribution. Equal grip strength. Sensation intact with equal distribution. First EKG poor quality - second EKG noted normal sinus rhythm with heart rate of 57 bpm . Troponin negative elevation. CBC negative elevation white blood cell count identified. BNP negative findings-kidneys functioning well. Hepatic function panel noted mildly elevated ALT- when compared to previous labs ALT has been as high as 64. Lipase negative elevation. Urine negative for hemoglobin-negative hematuria, negative nitrites and leukocytes identified-negative findings of infection. Right shoulder plain film negative for acute osseous findings-glenohumeral joint and acromioclavicular degenerative  changes are noted. Chest x-ray noted a stable chronic lung disease with mild interstitial prominence and central airway thickening that is suspicious for bronchitis-no acute cardiopulmonary process identified. Negative findings of left shoulder abnormalities noted to the chest xray.  Patient is not appear tachycardic, tachypneic, pulse ox 90% on room air-doubt PE. Doubt AAA. Doubt nephrolithiasis. Doubt cholangitis/cholecystitis. Doubt pyelonephritis. Suspicion to be bronchitis and muscular discomfort. Patient stable, afebrile. Patient no sign of respiratory distress-patient nontoxic-appearing. Discharged patient. Discharged patient with antibiotics, Z-Pak and muscle relaxer. Discussed with patient to rest and stay hydrated. Discussed with patient to avoid any physical or strenuous activity. Discussed with patient to closely monitor symptoms and if symptoms are to worsen or change to report back to the ED - strict return instructions given.  Patient agreed to plan of care, understood, all questions answered.   Raymon Mutton, PA-C 09/25/13 1811  Raymon Mutton, PA-C 09/25/13 1812

## 2013-09-25 NOTE — ED Provider Notes (Signed)
Medical screening examination/treatment/procedure(s) were performed by non-physician practitioner and as supervising physician I was immediately available for consultation/collaboration.   EKG Interpretation   Date/Time:  Sunday September 25 2013 16:24:16 EDT Ventricular Rate:  57 PR Interval:  194 QRS Duration: 86 QT Interval:  426 QTC Calculation: 415 R Axis:   77 Text Interpretation:  Sinus rhythm ST elevation, consider inferior injury  Poor data quality grossly unchanged since before  Reconfirmed by Deitrich Steve  MD,  Derrick Orris ([id number]) on 09/25/2013 6:11:19 PM        Richardean Canalavid H Sharnell Knight, MD 09/25/13 2337

## 2013-09-25 NOTE — Discharge Instructions (Signed)
Please call your doctor for a followup appointment within 24-48 hours. When you talk to your doctor please let them know that you were seen in the emergency department and have them acquire all of your records so that they can discuss the findings with you and formulate a treatment plan to fully care for your new and ongoing problems. Please call and set-up an appointment and keep appointment with [facility] Please rest and stay hydrated Please continue to apply warm compressions to the site, massage with icy-hot ointment and slow circular motions Please take antibiotics as prescribed Please continue to monitor symptoms closely and if symptoms are to worsen or change (fever greater than 101, chills, sweating, nausea, vomiting, diarrhea, abdominal pain, nausea, vomiting, diarrhea, chesty pain, shortness of breath, difficulty breathing, numbness, tingling, weakness, fall, injury, inability to grasp objects, sudden loss of vision, blurred vision) please report back to the ED immediately  Bronchitis Bronchitis is inflammation of the airways that extend from the windpipe into the lungs (bronchi). The inflammation often causes mucus to develop, which leads to a cough. If the inflammation becomes severe, it may cause shortness of breath. CAUSES  Bronchitis may be caused by:   Viral infections.   Bacteria.   Cigarette smoke.   Allergens, pollutants, and other irritants.  SIGNS AND SYMPTOMS  The most common symptom of bronchitis is a frequent cough that produces mucus. Other symptoms include:  Fever.   Body aches.   Chest congestion.   Chills.   Shortness of breath.   Sore throat.  DIAGNOSIS  Bronchitis is usually diagnosed through a medical history and physical exam. Tests, such as chest X-rays, are sometimes done to rule out other conditions.  TREATMENT  You may need to avoid contact with whatever caused the problem (smoking, for example). Medicines are sometimes  needed. These may include:  Antibiotics. These may be prescribed if the condition is caused by bacteria.  Cough suppressants. These may be prescribed for relief of cough symptoms.   Inhaled medicines. These may be prescribed to help open your airways and make it easier for you to breathe.   Steroid medicines. These may be prescribed for those with recurrent (chronic) bronchitis. HOME CARE INSTRUCTIONS  Get plenty of rest.   Drink enough fluids to keep your urine clear or pale yellow (unless you have a medical condition that requires fluid restriction). Increasing fluids may help thin your secretions and will prevent dehydration.   Only take over-the-counter or prescription medicines as directed by your health care provider.  Only take antibiotics as directed. Make sure you finish them even if you start to feel better.  Avoid secondhand smoke, irritating chemicals, and strong fumes. These will make bronchitis worse. If you are a smoker, quit smoking. Consider using nicotine gum or skin patches to help control withdrawal symptoms. Quitting smoking will help your lungs heal faster.   Put a cool-mist humidifier in your bedroom at night to moisten the air. This may help loosen mucus. Change the water in the humidifier daily. You can also run the hot water in your shower and sit in the bathroom with the door closed for 5 10 minutes.   Follow up with your health care provider as directed.   Wash your hands frequently to avoid catching bronchitis again or spreading an infection to others.  SEEK MEDICAL CARE IF: Your symptoms do not improve after 1 week of treatment.  SEEK IMMEDIATE MEDICAL CARE IF:  Your fever increases.  You have chills.  You have chest pain.   You have worsening shortness of breath.   You have bloody sputum.  You faint.  You have lightheadedness.  You have a severe headache.   You vomit repeatedly. MAKE SURE YOU:   Understand these  instructions.  Will watch your condition.  Will get help right away if you are not doing well or get worse. Document Released: 06/02/2005 Document Revised: 03/23/2013 Document Reviewed: 01/25/2013 [facility] Patient Information 2014 [place], [place].   Emergency Department Resource Guide 1) Find a Doctor and Pay Out of Pocket Although you won't have to find out who is covered by your insurance plan, it is a good idea to ask around and get recommendations. You will then need to call the office and see if the doctor you have chosen will accept you as a new patient and what types of options they offer for patients who are self-pay. Some doctors offer discounts or will set up payment plans for their patients who do not have insurance, but you will need to ask so you aren't surprised when you get to your appointment.  2) Contact Your Local Health Department Not all health departments have doctors that can see patients for sick visits, but many do, so it is worth a call to see if yours does. If you don't know where your local health department is, you can check in your phone book. The CDC also has a tool to help you locate your state's health department, and many state websites also have listings of all of their local health departments.  3) Find a Walk-in Clinic If your illness is not likely to be very severe or complicated, you may want to try a walk in clinic. These are popping up all over the country in pharmacies, drugstores, and shopping centers. They're usually staffed by nurse practitioners or physician assistants that have been trained to treat common illnesses and complaints. They're usually fairly quick and inexpensive. However, if you have serious medical issues or chronic medical problems, these are probably not your best option.  No Primary Care Doctor: - Call Health Connect at  [phone] - they can help you locate a primary care doctor that  accepts your insurance, provides certain services,  etc. - Physician Referral Service- [phone]  Chronic Pain Problems: Organization         Address  Phone   Notes  [facility]  [phone] Patients need to be referred by their primary care doctor.   Medication Assistance: Organization         Address  Phone   Notes  [facility] Medication [facility] [address] [place], [place] [address] [phone] --Must be a resident of [facility] -- Must have NO insurance coverage whatsoever (no Medicaid/ Medicare, etc.) -- The pt. MUST have a primary care doctor that directs their care regularly and follows them in the community   [facility]  [phone]   [employer]  [phone]    Agencies that provide inexpensive medical care: Organization         Address  Phone   Notes  [facility]  [phone]   [facility]    [phone]   [facility] [address] [place], [place] [address] [phone]   [facility] [address] [place] [place] [phone]   Planned Parenthood    [phone]   [facility]    [phone]   [facility] and [facility]  [address], [place] Phone:  [phone], Fax:  [phone](336) [phone] Hours of Operation:  9 am - 6 pm, M-F.  Also accepts Medicaid/Medicare and self-pay.  [facility]  [address], [place] Phone: [phone], Fax: [phone]) [phone]. Hours of Operation:  8:30 am - 5:30 pm, M-F.  Also accepts Medicaid and self-pay.  [facility] [place] [address], [place] Phone: [phone](336) [phone]   [facility] [address], [place], [place] [phone], Ext. [phone] Mondays & Thursdays: 7-9 AM.  First 15 patients are seen on a first come, first serve basis.    Medicaid-accepting [facility] [place] Providers:  Organization         Address  Phone   Notes  [facility] [address], [place] [phone](336) [phone] Also accepts self-pay patients.  [facility] [address], [place]  [phone](336) [phone]   [facility] [address], [place] [phone]) [phone]   [facility] [address] [place] [address], [place] [phone]) [phone]   Renaye RakersVeita Bland [address], [place]   [phone]) [phone] Only accepts [place] Access [place] patients after they have their name applied to their card.   Self-Pay (no insurance) in [facility] [place]:  Organization         Address  Phone   Notes  Sickle Cell Patients, [facility] [address], [place] [phone]) [phone]   [facility] [address], [place] [phone]) [phone]   [facility] [place]  [address],  [phone](336) [phone]   [facility]/Dr. Osei-Bonsu  [address] [place] [address], [place] or [phone], [place] [phone](336) [phone] Phone number for both [place] and [place] locations is the same.  [facility] [address], [place] [phone]   [facility] [place] [address] [place] [address], [place] or [address] [phone]   [facility] [address], [place] [phone](336) [phone], phone; [phone](336) [phone], fax Sees patients 1st and 3rd Saturday of every month.  Must not qualify for public or private insurance (i.e. Medicaid, Medicare, [place] Health Choice, Veterans' Benefits)  Household income should be no more than 200% of the poverty level The clinic cannot treat you if you are pregnant or think you are pregnant  Sexually transmitted diseases are not treated at the clinic.    Dental Care: Organization         Address  Phone  Notes  [facility] [address], [place] [phone]) [phone] Accepts children up to  age 57 who are enrolled in [place] or [place] Health Choice; pregnant women with a Medicaid card; and children who have applied for Medicaid or [place] Health Choice, but were declined, whose parents can pay a reduced fee at time of service.  [facility] [place]  [address], [place] [phone]) [phone] Accepts children up to age 57 who are enrolled in [place] or [place] Health Choice; pregnant women with a Medicaid card; and children who have applied for Medicaid or  Health Choice, but were declined, whose parents can pay a reduced fee at time of service.  [facility]  [address] [place], [place] [phone] Patients are seen by appointment only. Walk-ins are not accepted. [facility] will see patients 32 years of age and older. Monday - Tuesday (8am-5pm) Most Wednesdays (8:30-5pm) $30 per visit, cash only  [facility]  [address], [facility] [phone] Patients are seen by appointment only. Walk-ins are not accepted. [facility] will see patients 65 years of age and older. One Wednesday Evening (Monthly: Volunteer Based).  $30 per visit, cash only  [employer]  [phone] for adults; Children under age 31, call Graduate Pediatric Dentistry at [phone]. Children aged 36-14, please call [phone] to request a pediatric application.  Dental services are provided in all areas of dental care including fillings, crowns and bridges, complete and partial dentures, implants, gum treatment, root canals, and extractions. Preventive care is also provided. Treatment is provided to both adults and children. Patients are selected via a lottery and there is often a waiting list.   [facility] [address] [place]  [phone] [email or web address]   [facility] [address] [place], [place] [phone], Ext. [phone] Second and Fourth Thursday of  each month, opens at 6:30 AM; Clinic ends at 9 AM.  Patients are seen on a first-come first-served basis, and a limited number are seen during each clinic.   [facility]  [address] [place], [place] [phone]   Eligibility Requirements You must have lived in [place], [place], or [place] counties for at least the last three months.   You cannot be eligible for state or federal sponsored [place], including [employer], [place], or [employer].   You generally cannot be eligible for healthcare insurance through your employer.    How to apply: Eligibility screenings are held every Tuesday and Wednesday afternoon from 1:00 pm until 4:00 pm. You do not need an appointment for the interview!  [facility] [address] [place], [place] [phone]   [facility] Health Department  [phone]   [facility] Health Department  [phone]   [facility] Health Department  [phone]    Behavioral Health Resources in the Community: Intensive Outpatient Programs Organization         Address  Phone  Notes  [facility] [address] [place], [place] [phone]   [facility] [address] [place], [place] [phone]   [facility] [address] [place], [place]  [phone]   [facility] Mental Health [address]  [place], [place] [phone] or [phone]   Substance Abuse Resources Organization         Address  Phone  Notes  [facility]  [phone]   [facility]  [phone]   [facility]  [phone]   Floydene Flock  [phone]   Residential & Outpatient Substance Abuse Program  [phone]   Psychological Services Organization         Address  Phone  Notes  [facility] Behavioral Health  [phone]   [facility] Services  [phone]   [facility] Mental Health [address]  [place] [phone] or [phone]    Mobile Crisis Teams Organization         Address  Phone  Notes  [facility], Mobile Crisis Care Unit  [phone]   [facility]  [address] [place],  [place] [phone]   [facility] [address] [place] [phone]    Self-Help/Support Groups Organization         Address  Phone             Notes  [facility] [place] - variety of support groups  [phone]- [phone] Call for more information  Narcotics Anonymous (NA), [facility] [address], [employer] [place]  2 meetings at this location   Statistician         Address  Phone  Notes  [facility] [address],    [place]  [phone]   [facility]  [address] [place] [address], [place], [place] [phone]   [facility] [address] [place], [place] [phone] Admissions: 8am-3pm M-F  [facility] [address]    [place], [place] [phone]   [facility] [address] [place] [address] [place], [place], [place] [phone]   [facility] [address]  [place], [place] [phone]   [facility] - Intensive Outpatient [address], [place], [place] [phone]   [facility] ([facility] [address] [place]  [place], [place] [phone] or [phone]   [facility] ([facility]) [address], [place], [place] [phone] Accepts Medicaid  [facility] [address]  [place] [phone] Substance Abuse/Addiction Treatment   [facility] Organization         Address  Phone  Notes  [facility]  [phone]   Angie Fava, PhD [address] [place], [place]   [phone] or [phone]   [facility]   [address] [place], [place] [phone]     [facility] [address], [place], [place] [phone] Insurance/Medicaid/sponsorship through [facility] and Families [address]                                    [place], [place] [phone] Therapy/tele-psych/case  [facility] [address] [place], [place] [phone]    Dr. Lolly Mustache  [phone]   [facility] [place]  [employer] [facility] Dept. 1) [address], [place] 2) [address] [place] 3)  [address], [place] [phone]  [phone]   [facility] Child Abuse Hotline (712)[phone] or [phone] (After Hours)

## 2013-09-25 NOTE — ED Notes (Signed)
She c/o non-traumatic left post. Shoulder/thoracic area pain x 2 weeks. She is in no distress.

## 2013-12-14 ENCOUNTER — Encounter: Payer: Self-pay | Admitting: Gastroenterology

## 2014-01-21 ENCOUNTER — Emergency Department (HOSPITAL_COMMUNITY)
Admission: EM | Admit: 2014-01-21 | Discharge: 2014-01-21 | Disposition: A | Discharge status: Home / Self Care | Payer: BC Managed Care – PPO | Attending: Emergency Medicine | Admitting: Emergency Medicine

## 2014-01-21 ENCOUNTER — Emergency Department (HOSPITAL_COMMUNITY): Payer: BC Managed Care – PPO

## 2014-01-21 ENCOUNTER — Encounter (HOSPITAL_COMMUNITY): Payer: Self-pay | Admitting: Emergency Medicine

## 2014-01-21 DIAGNOSIS — F17200 Nicotine dependence, unspecified, uncomplicated: Secondary | ICD-10-CM | POA: Insufficient documentation

## 2014-01-21 DIAGNOSIS — Z90710 Acquired absence of both cervix and uterus: Secondary | ICD-10-CM | POA: Insufficient documentation

## 2014-01-21 DIAGNOSIS — IMO0002 Reserved for concepts with insufficient information to code with codable children: Secondary | ICD-10-CM | POA: Insufficient documentation

## 2014-01-21 DIAGNOSIS — J40 Bronchitis, not specified as acute or chronic: Secondary | ICD-10-CM

## 2014-01-21 DIAGNOSIS — I10 Essential (primary) hypertension: Secondary | ICD-10-CM | POA: Insufficient documentation

## 2014-01-21 DIAGNOSIS — Z79899 Other long term (current) drug therapy: Secondary | ICD-10-CM | POA: Insufficient documentation

## 2014-01-21 DIAGNOSIS — Z8719 Personal history of other diseases of the digestive system: Secondary | ICD-10-CM | POA: Insufficient documentation

## 2014-01-21 DIAGNOSIS — R079 Chest pain, unspecified: Secondary | ICD-10-CM | POA: Insufficient documentation

## 2014-01-21 DIAGNOSIS — M940 Chondrocostal junction syndrome [Tietze]: Secondary | ICD-10-CM

## 2014-01-21 MED ORDER — IPRATROPIUM BROMIDE 0.02 % IN SOLN
0.5000 mg | Freq: Once | RESPIRATORY_TRACT | Status: AC
  Administered 2014-01-21: 0.5 mg via RESPIRATORY_TRACT
  Filled 2014-01-21: qty 2.5

## 2014-01-21 MED ORDER — PREDNISONE 10 MG PO TABS: 20.0000 mg | ORAL_TABLET | Freq: Every day | ORAL | Status: DC

## 2014-01-21 MED ORDER — OXYCODONE-ACETAMINOPHEN 5-325 MG PO TABS
1.0000 | ORAL_TABLET | Freq: Once | ORAL | Status: AC
  Administered 2014-01-21: 1 via ORAL
  Filled 2014-01-21: qty 1

## 2014-01-21 MED ORDER — ONDANSETRON 4 MG PO TBDP
4.0000 mg | ORAL_TABLET | Freq: Once | ORAL | Status: AC
  Administered 2014-01-21: 4 mg via ORAL
  Filled 2014-01-21: qty 1

## 2014-01-21 MED ORDER — ALBUTEROL SULFATE HFA 108 (90 BASE) MCG/ACT IN AERS
2.0000 | INHALATION_SPRAY | RESPIRATORY_TRACT | Status: DC | PRN
  Filled 2014-01-21 (×2): qty 6.7

## 2014-01-21 MED ORDER — OXYCODONE-ACETAMINOPHEN 5-325 MG PO TABS: 1.0000 | ORAL_TABLET | Freq: Four times a day (QID) | ORAL | Status: DC | PRN

## 2014-01-21 MED ORDER — ALBUTEROL SULFATE (2.5 MG/3ML) 0.083% IN NEBU
5.0000 mg | INHALATION_SOLUTION | Freq: Once | RESPIRATORY_TRACT | Status: AC
Start: 2014-01-21 — End: 2014-01-21
  Administered 2014-01-21: 5 mg via RESPIRATORY_TRACT
  Filled 2014-01-21: qty 6

## 2014-01-21 NOTE — Discharge Instructions (Signed)
Costochondritis Costochondritis, sometimes called Tietze syndrome, is a swelling and irritation (inflammation) of the tissue (cartilage) that connects your ribs with your breastbone (sternum). It causes pain in the chest and rib area. Costochondritis usually goes away on its own over time. It can take up to 6 weeks or longer to get better, especially if you are unable to limit your activities. CAUSES  Some cases of costochondritis have no known cause. Possible causes include:  Injury (trauma).  Exercise or activity such as lifting.  Severe coughing. SIGNS AND SYMPTOMS  Pain and tenderness in the chest and rib area.  Pain that gets worse when coughing or taking deep breaths.  Pain that gets worse with specific movements. DIAGNOSIS  Your health care provider will do a physical exam and ask about your symptoms. Chest X-rays or other tests may be done to rule out other problems. TREATMENT  Costochondritis usually goes away on its own over time. Your health care provider may prescribe medicine to help relieve pain. HOME CARE INSTRUCTIONS   Avoid exhausting physical activity. Try not to strain your ribs during normal activity. This would include any activities using chest, abdominal, and side muscles, especially if heavy weights are used.  Apply ice to the affected area for the first 2 days after the pain begins.  Put ice in a plastic bag.  Place a towel between your skin and the bag.  Leave the ice on for 20 minutes, 2-3 times a day.  Only take over-the-counter or prescription medicines as directed by your health care provider. SEEK MEDICAL CARE IF:  You have redness or swelling at the rib joints. These are signs of infection.  Your pain does not go away despite rest or medicine. SEEK IMMEDIATE MEDICAL CARE IF:   Your pain increases or you are very uncomfortable.  You have shortness of breath or difficulty breathing.  You cough up blood.  You have worse chest pains,  sweating, or vomiting.  You have a fever or persistent symptoms for more than 2-3 days.  You have a fever and your symptoms suddenly get worse. MAKE SURE YOU:   Understand these instructions.  Will watch your condition.  Will get help right away if you are not doing well or get worse. Document Released: 03/12/2005 Document Revised: 03/23/2013 Document Reviewed: 01/04/2013 [facility] Patient Information 2015 [place], [place]. This information is not intended to replace advice given to you by your health care provider. Make sure you discuss any questions you have with your health care provider. Chronic Asthmatic Bronchitis Chronic asthmatic bronchitis is a complication of persistent asthma. After a period of time with asthma, some people develop airflow obstruction that is present all the time, even when not having an asthma attack.There is also persistent inflammation of the airways, and the bronchial tubes produce more mucus. Chronic asthmatic bronchitis usually is a permanent problem with the lungs. CAUSES  Chronic asthmatic bronchitis happens most often in people who have asthma and also smoke cigarettes. Occasionally, it can happen to a person with long-standing or severe asthma even if the person is not a smoker. SIGNS AND SYMPTOMS  Chronic asthmatic bronchitis usually causes symptoms of both asthma and chronic bronchitis, including:   Coughing.  Increased sputum production.  Wheezing and shortness of breath.  Chest discomfort.  Recurring infections. DIAGNOSIS  Your health care provider will take a medical history and perform a physical exam. Chronic asthmatic bronchitis is suspected when a person with asthma has abnormal results on breathing tests (pulmonary function  tests) even when breathing symptoms are at their best. Other tests, such as a chest X-ray, may be performed to rule out other conditions.  TREATMENT  Treatment involves controlling symptoms with medicine and lifestyle  changes.  Your health care provider may prescribe asthma medicines, including inhaler and nebulizer medicines.  Infection can be treated with medicine to kill germs (antibiotics). Serious infections may require hospitalization. These can include:  Pneumonia.  Sinus infections.  Acute bronchitis.   Preventing infection and hospitalization is very important. Get an influenza vaccination every year as directed by your health care provider. Ask your health care provider whether you need a pneumonia vaccine.  Ask your health care provider whether you would benefit from a pulmonary rehabilitation program. HOME CARE INSTRUCTIONS  Take medicines only as directed by your health care provider.  If you are a cigarette smoker, the most important thing that you can do is quit. Talk to your health care provider for help with quitting smoking.  Avoid pollen, dust, animal dander, molds, smoke, and other things that cause attacks.  Regular exercise is very important to help you feel better. Discuss possible exercise routines with your health care provider.  If animal dander is the cause of asthma, you may not be able to keep pets.  It is important that you:  Become educated about your medical condition.  Participate in maintaining wellness.  Seek medical care as directed. Delay in seeking medical care could cause permanent injury and may be a risk to your life. SEEK MEDICAL CARE IF:  You have wheezing and shortness of breath even if taking medicine to prevent attacks.  You have muscle aches, chest pain, or thickening of sputum.  Your sputum changes from clear or white to yellow, green, gray, or bloody. SEEK IMMEDIATE MEDICAL CARE IF:  Your usual medicines do not stop your wheezing.  You have increased coughing or shortness of breath or both.  You have increased difficulty breathing.  You have any problems from the medicine you are taking, such as a rash, itching, swelling, or trouble  breathing. MAKE SURE YOU:   Understand these instructions.  Will watch your condition.  Will get help right away if you are not doing well or get worse. Document Released: 03/20/2006 Document Revised: 10/17/2013 Document Reviewed: 07/11/2013 [facility] Patient Information 2015 [place]. This information is not intended to replace advice given to you by your health care provider. Make sure you discuss any questions you have with your health care provider.

## 2014-01-21 NOTE — ED Provider Notes (Signed)
Medical screening examination/treatment/procedure(s) were performed by non-physician practitioner and as supervising physician I was immediately available for consultation/collaboration.   EKG Interpretation None        Melanie MochaBlair Mariaelena Cade, MD 01/21/14 1529

## 2014-01-21 NOTE — ED Notes (Signed)
Pt states that she has had lt sided cp (under lt arm) x 1 wk.  Thinks "it may be her bronchitis".  States nausea.  States that she has sharp pain when she takes a deep breath.  C/o congestion, cough.

## 2014-01-21 NOTE — ED Provider Notes (Signed)
CSN: [id number]     Arrival date & time 01/21/14  1102 History   First MD Initiated Contact with Patient 01/21/14 1105     Chief Complaint  Patient presents with  . Chest Pain     (Consider location/radiation/quality/duration/timing/severity/associated sxs/prior Treatment) HPI  Patient to the ER with complaints of cough, wheezing, with clear sputum (previous red tinged but now clear), nasal congestion and feeling bad for 1 week. The pain is in the left lower rib and exacerbated by movement, laughing, big breaths and coughing. She has chronic bronchitis, is 58 years old and has been smoking since she was 37.  She has not had inhaler at home because she ran out, she has not had fever, headaches, confusion, abdominal pains, vaginal bleeding, vaginal discharge.  Past Medical History  Diagnosis Date  . Hypertension   . Chronic bronchitis   . Pancreatitis    Past Surgical History  Procedure Laterality Date  . Abdominal hysterectomy      partial   Family History  Problem Relation Age of Onset  . Heart attack Mother   . Stroke Maternal Grandmother   . Cancer Maternal Grandfather     bone   History  Substance Use Topics  . Smoking status: Current Every Day Smoker -- 1.00 packs/day for 40 years    Types: Cigarettes  . Smokeless tobacco: Never Used  . Alcohol Use: No   OB History   Grav Para Term Preterm Abortions TAB SAB Ect Mult Living   2 2 2       2      Review of Systems   Review of Systems  Gen: no weight loss, fevers, chills, night sweats  Eyes: no discharge or drainage, no occular pain or visual changes  Nose: no epistaxis + rhinorrhea  Mouth: no dental pain, no sore throat  Neck: no neck pain  Lungs:  + wheezing and coughing - hemoptysis CV:  No palpitations, dependent edema or orthopnea. No chest pain Abd: no diarrhea. No nausea or vomiting, No abdominal pain  GU: no dysuria or gross hematuria  MSK:  No muscle weakness, + chest wall  pain Neuro: no headache, no  focal neurologic deficits  Skin: no rash , no wounds Psyche: no complaints    Allergies  Review of patient's allergies indicates no known allergies.  Home Medications   Prior to Admission medications   Medication Sig Start Date End Date Taking? Authorizing Provider  glucosamine-chondroitin 500-400 MG tablet Take 1 tablet by mouth daily after breakfast.   Yes Historical Provider, MD  oxyCODONE-acetaminophen (PERCOCET/ROXICET) 5-325 MG per tablet Take 1 tablet by mouth every 6 (six) hours as needed for severe pain. 01/21/14   Cady Hafen Irine Seal, PA-C  predniSONE (DELTASONE) 10 MG tablet Take 2 tablets (20 mg total) by mouth daily. 01/21/14   Asna Muldrow Irine Seal, PA-C   BP 145/92  Pulse 71  Temp(Src) 98.5 F (36.9 C) (Oral)  Resp 18  SpO2 98% Physical Exam  Nursing note and vitals reviewed. Constitutional: She appears well-developed and well-nourished. No distress.  HENT:  Head: Normocephalic and atraumatic.  Eyes: Pupils are equal, round, and reactive to light.  Neck: Normal range of motion. Neck supple.  Cardiovascular: Normal rate and regular rhythm.   Pulmonary/Chest: Effort normal. She has no decreased breath sounds. She has wheezes (diffuse wheezing). She has no rhonchi. She has no rales. Chest wall is not dull to percussion. She exhibits tenderness and bony tenderness. She exhibits no laceration and no crepitus.    +  pain with coughing, laughing and movement.  Abdominal: Soft.  Neurological: She is alert.  Skin: Skin is warm and dry.    ED Course  Procedures (including critical care time) Labs Review Labs Reviewed - No data to display  Imaging Review Dg Chest 2 View  01/21/2014   CLINICAL DATA:  Left chest pain and cough.  EXAM: CHEST  2 VIEW  COMPARISON:  09/25/2013 and prior chest radiographs dating back to 05/26/2004  FINDINGS: The cardiomediastinal silhouette is unremarkable.  There is no evidence of focal airspace disease, pulmonary edema, suspicious pulmonary  nodule/mass, pleural effusion, or pneumothorax. No acute bony abnormalities are identified.  IMPRESSION: No active cardiopulmonary disease.   Electronically Signed   By: Laveda AbbeJeff  Hu M.D.   On: 01/21/2014 12:27     EKG Interpretation None     Mia CreekSALIFOU, Nobuko [id number] 21-Jan-2014 11:13:57 [facility] ED ROUTINE RECORD Sinus rhythm 7525mm/s 1510mm/mV 150Hz  8.0.1 CID: [id number] Referred by: Unconfirmed Vent. rate 72 BPM PR interval 134 ms QRS duration 81 ms QT/QTc 396/433 ms P-R-T axes -8 71 56  MDM   Final diagnoses:  Bronchitis  Costochondritis    Medications  albuterol (PROVENTIL HFA;VENTOLIN HFA) 108 (90 BASE) MCG/ACT inhaler 2 puff (not administered)  albuterol (PROVENTIL) (2.5 MG/3ML) 0.083% nebulizer solution 5 mg (5 mg Nebulization Given 01/21/14 1304)  ipratropium (ATROVENT) nebulizer solution 0.5 mg (0.5 mg Nebulization Given 01/21/14 1304)  oxyCODONE-acetaminophen (PERCOCET/ROXICET) 5-325 MG per tablet 1 tablet (1 tablet Oral Given 01/21/14 1300)  ondansetron (ZOFRAN-ODT) disintegrating tablet 4 mg (4 mg Oral Given 01/21/14 1300)    Her symptoms improved significantly in the ED with treatment. I found her in the hallway going to visit a patient in the room across the hospital. She says she feels much better and her friend is in the room so she wanted to go say hello.   She was given an Rx for prednisone and pain. Albuterol inhaler for home. Highly doubt ACS or PE given her symptoms and there resolution with breathing treatment.  58 y.o.Leaner Maclellan's evaluation in the Emergency Department is complete. It has been determined that no acute conditions requiring further emergency intervention are present at this time. The patient/guardian have been advised of the diagnosis and plan. We have discussed signs and symptoms that warrant return to the ED, such as changes or worsening in symptoms.  Vital signs are stable at discharge. Filed Vitals:   01/21/14 1347  BP:  145/92  Pulse: 71  Temp: 98.5 F (36.9 C)  Resp: 18    Patient/guardian has voiced understanding and agreed to follow-up with the PCP or specialist.   Dorthula Matasiffany G Finleigh Cheong, PA-C 01/21/14 1506

## 2014-01-21 NOTE — ED Notes (Signed)
She tells me she has had minimally productive cough x ~ 1 week.  She states the first day she had the cough she "coughed up just a little blood", but since then her phlegm has been "clear".  She c/o lateral left thoracic area pain x 1 week also. Her skin is normal, warm and dry and she is breathing normally. Monitor shows nsr without ectopy.

## 2014-02-10 ENCOUNTER — Ambulatory Visit (AMBULATORY_SURGERY_CENTER): Payer: BC Managed Care – PPO

## 2014-02-10 VITALS — Ht 68.0 in | Wt 202.0 lb

## 2014-02-10 DIAGNOSIS — Z1211 Encounter for screening for malignant neoplasm of colon: Secondary | ICD-10-CM

## 2014-02-10 MED ORDER — SUPREP BOWEL PREP KIT 17.5-3.13-1.6 GM/177ML PO SOLN: 1.0000 | Freq: Once | ORAL | Status: DC

## 2014-02-10 NOTE — Progress Notes (Signed)
No allergies to eggs or soy No past problems with anesthesia No home oxygen No diet/weight loss meds  Has email  Emmi instructions given for colonoscopy 

## 2014-02-23 ENCOUNTER — Encounter: Payer: Self-pay | Admitting: Gastroenterology

## 2014-02-24 ENCOUNTER — Encounter: Payer: BC Managed Care – PPO | Admitting: Gastroenterology

## 2014-03-02 ENCOUNTER — Ambulatory Visit (AMBULATORY_SURGERY_CENTER): Payer: BC Managed Care – PPO | Admitting: Gastroenterology

## 2014-03-02 ENCOUNTER — Encounter: Payer: Self-pay | Admitting: Gastroenterology

## 2014-03-02 VITALS — BP 135/61 | HR 62 | Temp 98.5°F | Resp 20 | Ht 68.0 in | Wt 202.0 lb

## 2014-03-02 DIAGNOSIS — Z1211 Encounter for screening for malignant neoplasm of colon: Secondary | ICD-10-CM

## 2014-03-02 MED ORDER — SODIUM CHLORIDE 0.9 % IV SOLN: 500.0000 mL | INTRAVENOUS | Status: DC

## 2014-03-02 NOTE — Op Note (Signed)
[facility] [address] [place], [address]   COLONOSCOPY PROCEDURE REPORT  PATIENT: Melanie Guzman, Melanie Guzman  MR#: [id number] BIRTHDATE: 11-Apr-1956 , 58  yrs. old GENDER: Female ENDOSCOPIST: Louis Meckel, MD REFERRED BY: PROCEDURE DATE:  03/02/2014 PROCEDURE:   Colonoscopy, diagnostic First Screening Colonoscopy - Avg.  risk and is 50 yrs.  old or older Yes.  Prior Negative Screening - Now for repeat screening. N/A  History of Adenoma - Now for follow-up colonoscopy & has been > or = to 3 yrs.  N/A  Polyps Removed Today? No.  Recommend repeat exam, <10 yrs? No. ASA CLASS:   Class II INDICATIONS:average risk screening. MEDICATIONS: MAC sedation, administered by CRNA and propofol (Diprivan)  IV  DESCRIPTION OF PROCEDURE:   After the risks benefits and alternatives of the procedure were thoroughly explained, informed consent was obtained.  A digital rectal exam revealed no abnormalities of the rectum.   The LB [id number]  endoscope was introduced through the anus and advanced to the cecum, which was identified by both the appendix and ileocecal valve. No adverse events experienced.   The quality of the prep was excellent using Suprep  The instrument was then slowly withdrawn as the colon was fully examined.      COLON FINDINGS: A normal appearing cecum, ileocecal valve, and appendiceal orifice were identified.  The ascending, hepatic flexure, transverse, splenic flexure, descending, sigmoid colon and rectum appeared unremarkable.  No polyps or cancers were seen. Retroflexed views revealed no abnormalities. The time to cecum=2 minutes 07 seconds.  Withdrawal time=6 minutes 03 seconds.  The scope was withdrawn and the procedure completed. COMPLICATIONS: There were no complications.  ENDOSCOPIC IMPRESSION: Normal colon  RECOMMENDATIONS: Continue current colorectal screening recommendations for "routine risk" patients with a repeat colonoscopy in  10 years.   eSigned:  Louis Meckel, MD 03/02/2014 10:43 AM   cc: Voncille Lo, MD

## 2014-03-02 NOTE — Patient Instructions (Signed)
YOU HAD AN ENDOSCOPIC PROCEDURE TODAY AT THE [facility]: Refer to the procedure report that was given to you for any specific questions about what was found during the examination.  If the procedure report does not answer your questions, please call your gastroenterologist to clarify.  If you requested that your care partner not be given the details of your procedure findings, then the procedure report has been included in a sealed envelope for you to review at your convenience later.  YOU SHOULD EXPECT: Some feelings of bloating in the abdomen. Passage of more gas than usual.  Walking can help get rid of the air that was put into your GI tract during the procedure and reduce the bloating. If you had a lower endoscopy (such as a colonoscopy or flexible sigmoidoscopy) you may notice spotting of blood in your stool or on the toilet paper. If you underwent a bowel prep for your procedure, then you may not have a normal bowel movement for a few days.  DIET: Your first meal following the procedure should be a light meal and then it is ok to progress to your normal diet.  A half-sandwich or bowl of soup is an example of a good first meal.  Heavy or fried foods are harder to digest and may make you feel nauseous or bloated.  Likewise meals heavy in dairy and vegetables can cause extra gas to form and this can also increase the bloating.  Drink plenty of fluids but you should avoid alcoholic beverages for 24 hours.  ACTIVITY: Your care partner should take you home directly after the procedure.  You should plan to take it easy, moving slowly for the rest of the day.  You can resume normal activity the day after the procedure however you should NOT DRIVE or use heavy machinery for 24 hours (because of the sedation medicines used during the test).    SYMPTOMS TO REPORT IMMEDIATELY: A gastroenterologist can be reached at any hour.  During normal business hours, 8:30 AM to 5:00 PM Monday through Friday,  call [phone].  After hours and on weekends, please call the GI answering service at [phone] who will take a message and have the physician on call contact you.   Following lower endoscopy (colonoscopy or flexible sigmoidoscopy):  Excessive amounts of blood in the stool  Significant tenderness or worsening of abdominal pains  Swelling of the abdomen that is new, acute  Fever of 100F or higher    FOLLOW UP: If any biopsies were taken you will be contacted by phone or by letter within the next 1-3 weeks.  Call your gastroenterologist if you have not heard about the biopsies in 3 weeks.  Our staff will call the home number listed on your records the next business day following your procedure to check on you and address any questions or concerns that you may have at that time regarding the information given to you following your procedure. This is a courtesy call and so if there is no answer at the home number and we have not heard from you through the emergency physician on call, we will assume that you have returned to your regular daily activities without incident.  SIGNATURES/CONFIDENTIALITY: You and/or your care partner have signed paperwork which will be entered into your electronic medical record.  These signatures attest to the fact that that the information above on your After Visit Summary has been reviewed and is understood.  Full responsibility of the confidentiality   of this discharge information lies with you and/or your care-partner.     

## 2014-03-02 NOTE — Progress Notes (Signed)
Report to PACU, RN, vss, BBS= Clear.  

## 2014-03-03 ENCOUNTER — Telehealth: Payer: Self-pay | Admitting: *Deleted

## 2014-03-03 NOTE — Telephone Encounter (Signed)
  Follow up Call-  Call back number 03/02/2014  Post procedure Call Back phone  # [phone]  Permission to leave phone message No     Patient questions:  Do you have a fever, pain , or abdominal swelling? No. Pain Score  0 *  Have you tolerated food without any problems? Yes.    Have you been able to return to your normal activities? Yes.    Do you have any questions about your discharge instructions: Diet   No. Medications  No. Follow up visit  No.  Do you have questions or concerns about your Care? No.  Actions: * If pain score is 4 or above: No action needed, pain <4.

## 2014-03-28 ENCOUNTER — Emergency Department (HOSPITAL_COMMUNITY)
Admission: EM | Admit: 2014-03-28 | Discharge: 2014-03-28 | Disposition: A | Discharge status: Home / Self Care | Payer: BC Managed Care – PPO | Attending: Emergency Medicine | Admitting: Emergency Medicine

## 2014-03-28 ENCOUNTER — Encounter (HOSPITAL_COMMUNITY): Payer: Self-pay | Admitting: Emergency Medicine

## 2014-03-28 DIAGNOSIS — Z8719 Personal history of other diseases of the digestive system: Secondary | ICD-10-CM | POA: Diagnosis not present

## 2014-03-28 DIAGNOSIS — Z79899 Other long term (current) drug therapy: Secondary | ICD-10-CM | POA: Insufficient documentation

## 2014-03-28 DIAGNOSIS — R110 Nausea: Secondary | ICD-10-CM | POA: Diagnosis present

## 2014-03-28 DIAGNOSIS — I10 Essential (primary) hypertension: Secondary | ICD-10-CM | POA: Diagnosis not present

## 2014-03-28 DIAGNOSIS — R51 Headache: Secondary | ICD-10-CM | POA: Insufficient documentation

## 2014-03-28 DIAGNOSIS — J069 Acute upper respiratory infection, unspecified: Secondary | ICD-10-CM | POA: Diagnosis not present

## 2014-03-28 DIAGNOSIS — Z720 Tobacco use: Secondary | ICD-10-CM | POA: Insufficient documentation

## 2014-03-28 MED ORDER — FLUTICASONE PROPIONATE 50 MCG/ACT NA SUSP: 2.0000 | Freq: Every day | NASAL | Status: DC

## 2014-03-28 MED ORDER — CETIRIZINE-PSEUDOEPHEDRINE ER 5-120 MG PO TB12: 1.0000 | ORAL_TABLET | Freq: Every day | ORAL | Status: DC

## 2014-03-28 MED ORDER — ONDANSETRON 4 MG PO TBDP: ORAL_TABLET | ORAL | Status: DC

## 2014-03-28 MED ORDER — AMOXICILLIN-POT CLAVULANATE 875-125 MG PO TABS: 1.0000 | ORAL_TABLET | Freq: Two times a day (BID) | ORAL | Status: DC

## 2014-03-28 NOTE — ED Notes (Signed)
Pt c/o nausea and nasal drainage x 2 weeks and headache x 1 week.  Pain score 8/10.  Pt reports taking OTC sinus medication w/o relief.

## 2014-03-28 NOTE — Discharge Instructions (Signed)
Upper Respiratory Infection, Adult An upper respiratory infection (URI) is also sometimes known as the common cold. The upper respiratory tract includes the nose, sinuses, throat, trachea, and bronchi. Bronchi are the airways leading to the lungs. Most people improve within 1 week, but symptoms can last up to 2 weeks. A residual cough may last even longer.  CAUSES Many different viruses can infect the tissues lining the upper respiratory tract. The tissues become irritated and inflamed and often become very moist. Mucus production is also common. A cold is contagious. You can easily spread the virus to others by oral contact. This includes kissing, sharing a glass, coughing, or sneezing. Touching your mouth or nose and then touching a surface, which is then touched by another person, can also spread the virus. SYMPTOMS  Symptoms typically develop 1 to 3 days after you come in contact with a cold virus. Symptoms vary from person to person. They may include:  Runny nose.  Sneezing.  Nasal congestion.  Sinus irritation.  Sore throat.  Loss of voice (laryngitis).  Cough.  Fatigue.  Muscle aches.  Loss of appetite.  Headache.  Low-grade fever. DIAGNOSIS  You might diagnose your own cold based on familiar symptoms, since most people get a cold 2 to 3 times a year. Your caregiver can confirm this based on your exam. Most importantly, your caregiver can check that your symptoms are not due to another disease such as strep throat, sinusitis, pneumonia, asthma, or epiglottitis. Blood tests, throat tests, and X-rays are not necessary to diagnose a common cold, but they may sometimes be helpful in excluding other more serious diseases. Your caregiver will decide if any further tests are required. RISKS AND COMPLICATIONS  You may be at risk for a more severe case of the common cold if you smoke cigarettes, have chronic heart disease (such as heart failure) or lung disease (such as asthma), or if  you have a weakened immune system. The very young and very old are also at risk for more serious infections. Bacterial sinusitis, middle ear infections, and bacterial pneumonia can complicate the common cold. The common cold can worsen asthma and chronic obstructive pulmonary disease (COPD). Sometimes, these complications can require emergency medical care and may be life-threatening. PREVENTION  The best way to protect against getting a cold is to practice good hygiene. Avoid oral or hand contact with people with cold symptoms. Wash your hands often if contact occurs. There is no clear evidence that vitamin C, vitamin E, echinacea, or exercise reduces the chance of developing a cold. However, it is always recommended to get plenty of rest and practice good nutrition. TREATMENT  Treatment is directed at relieving symptoms. There is no cure. Antibiotics are not effective, because the infection is caused by a virus, not by bacteria. Treatment may include:  Increased fluid intake. Sports drinks offer valuable electrolytes, sugars, and fluids.  Breathing heated mist or steam (vaporizer or shower).  Eating chicken soup or other clear broths, and maintaining good nutrition.  Getting plenty of rest.  Using gargles or lozenges for comfort.  Controlling fevers with ibuprofen or acetaminophen as directed by your caregiver.  Increasing usage of your inhaler if you have asthma. Zinc gel and zinc lozenges, taken in the first 24 hours of the common cold, can shorten the duration and lessen the severity of symptoms. Pain medicines may help with fever, muscle aches, and throat pain. A variety of non-prescription medicines are available to treat congestion and runny nose. Your caregiver   can make recommendations and may suggest nasal or lung inhalers for other symptoms.  HOME CARE INSTRUCTIONS   Only take over-the-counter or prescription medicines for pain, discomfort, or fever as directed by your  caregiver.  Use a warm mist humidifier or inhale steam from a shower to increase air moisture. This may keep secretions moist and make it easier to breathe.  Drink enough water and fluids to keep your urine clear or pale yellow.  Rest as needed.  Return to work when your temperature has returned to normal or as your caregiver advises. You may need to stay home longer to avoid infecting others. You can also use a face mask and careful hand washing to prevent spread of the virus. SEEK MEDICAL CARE IF:   After the first few days, you feel you are getting worse rather than better.  You need your caregiver's advice about medicines to control symptoms.  You develop chills, worsening shortness of breath, or brown or red sputum. These may be signs of pneumonia.  You develop yellow or brown nasal discharge or pain in the face, especially when you bend forward. These may be signs of sinusitis.  You develop a fever, swollen neck glands, pain with swallowing, or white areas in the back of your throat. These may be signs of strep throat. SEEK IMMEDIATE MEDICAL CARE IF:   You have a fever.  You develop severe or persistent headache, ear pain, sinus pain, or chest pain.  You develop wheezing, a prolonged cough, cough up blood, or have a change in your usual mucus (if you have chronic lung disease).  You develop sore muscles or a stiff neck. Document Released: 11/26/2000 Document Revised: 08/25/2011 Document Reviewed: 09/07/2013 [employer] Patient Information 2015 [employer]. This information is not intended to replace advice given to you by your health care provider. Make sure you discuss any questions you have with your health care provider.  

## 2014-03-28 NOTE — ED Notes (Signed)
Pt c/o two wks not feeling well; sinus pressure; headache; coughing up small amount green phlem; sinus pressure with right sided facial pressure

## 2014-03-28 NOTE — ED Provider Notes (Signed)
CSN: [id number]     Arrival date & time 03/28/14  [id number] History   First MD Initiated Contact with Patient 03/28/14 [phone]     Chief Complaint  Patient presents with  . Headache  . Nausea     (Consider location/radiation/quality/duration/timing/severity/associated sxs/prior Treatment) Patient is a 58 y.o. female presenting with headaches. The history is provided by the patient.  Headache Pain location:  Frontal Quality:  Dull Radiates to:  Does not radiate Severity currently:  8/10 Severity at highest:  9/10 Onset quality:  Gradual Duration:  2 weeks Timing:  Constant Progression:  Unchanged Chronicity:  New Context comment:  URI Relieved by:  Nothing Worsened by:  Nothing tried Ineffective treatments: nasal decongestant. Associated symptoms: congestion, cough (mild), nausea and sinus pressure   Associated symptoms: no abdominal pain, no back pain, no diarrhea, no dizziness, no pain, no fatigue, no fever, no neck pain and no vomiting     Past Medical History  Diagnosis Date  . Hypertension   . Chronic bronchitis   . Pancreatitis    Past Surgical History  Procedure Laterality Date  . Abdominal hysterectomy      partial   Family History  Problem Relation Age of Onset  . Heart attack Mother   . Stroke Maternal Grandmother   . Cancer Maternal Grandfather     bone  . Pancreatic cancer Paternal Grandmother   . Colon cancer Neg Hx    History  Substance Use Topics  . Smoking status: Current Every Day Smoker -- 1.00 packs/day for 40 years    Types: Cigarettes  . Smokeless tobacco: Never Used  . Alcohol Use: No   OB History   Grav Para Term Preterm Abortions TAB SAB Ect Mult Living   2 2 2       2      Review of Systems  Constitutional: Negative for fever and fatigue.  HENT: Positive for congestion, rhinorrhea and sinus pressure. Negative for drooling.   Eyes: Negative for pain.  Respiratory: Positive for cough (mild). Negative for shortness of breath.    Cardiovascular: Negative for chest pain.  Gastrointestinal: Positive for nausea. Negative for vomiting, abdominal pain and diarrhea.  Genitourinary: Negative for dysuria and hematuria.  Musculoskeletal: Negative for back pain, gait problem and neck pain.  Skin: Negative for color change.  Neurological: Positive for headaches. Negative for dizziness.  Hematological: Negative for adenopathy.  Psychiatric/Behavioral: Negative for behavioral problems.  All other systems reviewed and are negative.     Allergies  Review of patient's allergies indicates no known allergies.  Home Medications   Prior to Admission medications   Medication Sig Start Date End Date Taking? Authorizing Provider  albuterol (PROVENTIL HFA;VENTOLIN HFA) 108 (90 BASE) MCG/ACT inhaler Inhale 2 puffs into the lungs every 4 (four) hours as needed for wheezing or shortness of breath.   Yes Historical Provider, MD  Multiple Vitamin (MULTIVITAMIN WITH MINERALS) TABS tablet Take 1 tablet by mouth daily.   Yes Historical Provider, MD  Phenylephrine-Acetaminophen (SINUS CONGESTION/PAIN DAYTIME) 5-325 MG TABS Take 2 tablets by mouth daily as needed (for cold).   Yes Historical Provider, MD   BP 133/95  Pulse 82  Temp(Src) 98.4 F (36.9 C) (Oral)  Resp 16  SpO2 97% Physical Exam  Nursing note and vitals reviewed. Constitutional: She is oriented to person, place, and time. She appears well-developed and well-nourished.  HENT:  Head: Normocephalic and atraumatic.  Mouth/Throat: Oropharynx is clear and moist. No oropharyngeal exudate.  Mild anterior cervical adenopathy  bilaterally.  Normal appearing tympanic membranes bilaterally.  Eyes: Conjunctivae and EOM are normal. Pupils are equal, round, and reactive to light.  Neck: Normal range of motion. Neck supple.  Cardiovascular: Normal rate, regular rhythm, normal heart sounds and intact distal pulses.  Exam reveals no gallop and no friction rub.   No murmur  heard. Pulmonary/Chest: Effort normal and breath sounds normal. No respiratory distress. She has no wheezes.  Abdominal: Soft. Bowel sounds are normal. There is no tenderness. There is no rebound and no guarding.  Musculoskeletal: Normal range of motion. She exhibits no edema and no tenderness.  Neurological: She is alert and oriented to person, place, and time.  Skin: Skin is warm and dry.  Psychiatric: She has a normal mood and affect. Her behavior is normal.    ED Course  Procedures (including critical care time) Labs Review Labs Reviewed - No data to display  Imaging Review No results found.   EKG Interpretation None      MDM   Final diagnoses:  URI (upper respiratory infection)    8:39 AM 58 y.o. female who pw sinus congestion, frontal headache, facial pressure, nausea for 1-2 weeks. AFVSS here. Appears well on exam. Denies any fevers. Suspect URI and sinusitis. Given that her sinus pressure and congestion is worsening after 2 weeks will prescribe Augmentin.  8:54 AM:  I have discussed the diagnosis/risks/treatment options with the patient and believe the pt to be eligible for discharge home to follow-up with her pcp as needed. We also discussed returning to the ED immediately if new or worsening sx occur. We discussed the sx which are most concerning (e.g., worsening pain, fever) that necessitate immediate return. Medications administered to the patient during their visit and any new prescriptions provided to the patient are listed below.  Medications given during this visit Medications - No data to display  New Prescriptions   AMOXICILLIN-CLAVULANATE (AUGMENTIN) 875-125 MG PER TABLET    Take 1 tablet by mouth 2 (two) times daily. One po bid x 7 days   CETIRIZINE-PSEUDOEPHEDRINE (ZYRTEC-D) 5-120 MG PER TABLET    Take 1 tablet by mouth daily.   FLUTICASONE (FLONASE) 50 MCG/ACT NASAL SPRAY    Place 2 sprays into both nostrils daily.       Purvis SheffieldForrest Lisl Slingerland, MD 03/28/14  [phone]

## 2014-04-17 ENCOUNTER — Encounter (HOSPITAL_COMMUNITY): Payer: Self-pay | Admitting: Emergency Medicine

## 2014-11-08 ENCOUNTER — Emergency Department (HOSPITAL_COMMUNITY)
Admission: EM | Admit: 2014-11-08 | Discharge: 2014-11-08 | Disposition: A | Discharge status: Home / Self Care | Payer: BLUE CROSS/BLUE SHIELD | Attending: Emergency Medicine | Admitting: Emergency Medicine

## 2014-11-08 ENCOUNTER — Encounter (HOSPITAL_COMMUNITY): Payer: Self-pay

## 2014-11-08 ENCOUNTER — Emergency Department (HOSPITAL_COMMUNITY): Payer: BLUE CROSS/BLUE SHIELD

## 2014-11-08 DIAGNOSIS — Z720 Tobacco use: Secondary | ICD-10-CM | POA: Insufficient documentation

## 2014-11-08 DIAGNOSIS — I10 Essential (primary) hypertension: Secondary | ICD-10-CM | POA: Insufficient documentation

## 2014-11-08 DIAGNOSIS — Z79899 Other long term (current) drug therapy: Secondary | ICD-10-CM | POA: Insufficient documentation

## 2014-11-08 DIAGNOSIS — R059 Cough, unspecified: Secondary | ICD-10-CM

## 2014-11-08 DIAGNOSIS — Z8719 Personal history of other diseases of the digestive system: Secondary | ICD-10-CM | POA: Diagnosis not present

## 2014-11-08 DIAGNOSIS — Z792 Long term (current) use of antibiotics: Secondary | ICD-10-CM | POA: Diagnosis not present

## 2014-11-08 DIAGNOSIS — Z7951 Long term (current) use of inhaled steroids: Secondary | ICD-10-CM | POA: Diagnosis not present

## 2014-11-08 DIAGNOSIS — Z8709 Personal history of other diseases of the respiratory system: Secondary | ICD-10-CM | POA: Insufficient documentation

## 2014-11-08 DIAGNOSIS — R062 Wheezing: Secondary | ICD-10-CM | POA: Insufficient documentation

## 2014-11-08 DIAGNOSIS — R05 Cough: Secondary | ICD-10-CM | POA: Diagnosis present

## 2014-11-08 MED ORDER — ALBUTEROL SULFATE HFA 108 (90 BASE) MCG/ACT IN AERS
2.0000 | INHALATION_SPRAY | Freq: Once | RESPIRATORY_TRACT | Status: AC
  Administered 2014-11-08: 2 via RESPIRATORY_TRACT
  Filled 2014-11-08: qty 6.7

## 2014-11-08 MED ORDER — PREDNISONE 20 MG PO TABS
60.0000 mg | ORAL_TABLET | Freq: Once | ORAL | Status: AC
  Administered 2014-11-08: 60 mg via ORAL
  Filled 2014-11-08: qty 3

## 2014-11-08 MED ORDER — MAGNESIUM CITRATE PO SOLN: 1.0000 | Freq: Once | ORAL | Status: DC

## 2014-11-08 MED ORDER — PREDNISONE 20 MG PO TABS: 60.0000 mg | ORAL_TABLET | Freq: Every day | ORAL | Status: DC

## 2014-11-08 MED ORDER — IPRATROPIUM BROMIDE 0.02 % IN SOLN
1.0000 mg | Freq: Once | RESPIRATORY_TRACT | Status: AC
  Administered 2014-11-08: 1 mg via RESPIRATORY_TRACT
  Filled 2014-11-08: qty 5

## 2014-11-08 MED ORDER — ALBUTEROL (5 MG/ML) CONTINUOUS INHALATION SOLN
10.0000 mg/h | INHALATION_SOLUTION | RESPIRATORY_TRACT | Status: DC
  Administered 2014-11-08: 10 mg/h via RESPIRATORY_TRACT
  Filled 2014-11-08: qty 20

## 2014-11-08 NOTE — ED Provider Notes (Signed)
CSN: [id number]     Arrival date & time 11/08/14  0549 History   First MD Initiated Contact with Patient 11/08/14 [phone]     Chief Complaint  Patient presents with  . URI     (Consider location/radiation/quality/duration/timing/severity/associated sxs/prior Treatment) HPI  Claudean Leavelle is a 59 y.o. female with past medical history of hypertension, bronchitis, pancreatitis coming in today with viral URI like symptoms. Patient states this has been going on for the past 2 weeks. She describes a productive cough of yellow sputum, congestion, sinus pressure. She has sick contacts in the elderly and it's in her building. She states she has had subjective fevers during the interval. She has no chest pain or shortness of breath. Patient has no further complaints.  10 Systems reviewed and are negative for acute change except as noted in the HPI.    Past Medical History  Diagnosis Date  . Hypertension   . Chronic bronchitis   . Pancreatitis    Past Surgical History  Procedure Laterality Date  . Abdominal hysterectomy      partial   Family History  Problem Relation Age of Onset  . Heart attack Mother   . Stroke Maternal Grandmother   . Cancer Maternal Grandfather     bone  . Pancreatic cancer Paternal Grandmother   . Colon cancer Neg Hx    History  Substance Use Topics  . Smoking status: Current Every Day Smoker -- 1.00 packs/day for 40 years    Types: Cigarettes  . Smokeless tobacco: Never Used  . Alcohol Use: No   OB History    Gravida Para Term Preterm AB TAB SAB Ectopic Multiple Living   Review of Systems    Allergies  Review of patient's allergies indicates no known allergies.  Home Medications   Prior to Admission medications   Medication Sig Start Date End Date Taking? Authorizing Provider  Chlorphen-Phenyleph-ASA (ALKA-SELTZER PLUS COLD PO) Take 2 capsules by mouth 2 (two) times daily as needed (cold symptoms).   Yes Historical Provider, MD   fluticasone (FLONASE) 50 MCG/ACT nasal spray Place 2 sprays into both nostrils daily. 03/28/14  Yes Purvis Sheffield, MD  Multiple Vitamin (MULTIVITAMIN WITH MINERALS) TABS tablet Take 1 tablet by mouth daily.   Yes Historical Provider, MD  amoxicillin-clavulanate (AUGMENTIN) 875-125 MG per tablet Take 1 tablet by mouth 2 (two) times daily. One po bid x 7 days Patient not taking: Reported on 11/08/2014 03/28/14   Purvis Sheffield, MD  cetirizine-pseudoephedrine (ZYRTEC-D) 5-120 MG per tablet Take 1 tablet by mouth daily. Patient not taking: Reported on 11/08/2014 03/28/14   Purvis Sheffield, MD  ondansetron (ZOFRAN ODT) 4 MG disintegrating tablet  ODT q4 hours prn nausea/vomit Patient not taking: Reported on 11/08/2014 03/28/14   Purvis Sheffield, MD   BP 159/89 mmHg  Pulse 73  Temp(Src) 98 F (36.7 C) (Oral)  Resp 20  Ht  (1.727 m)  Wt 200 lb (90.719 kg)  BMI 30.42 kg/m2  SpO2 98% Physical Exam  Constitutional: She is oriented to person, place, and time. She appears well-developed and well-nourished. No distress.  HENT:  Head: Normocephalic and atraumatic.  Nose: Nose normal.  Mouth/Throat: Oropharynx is clear and moist. No oropharyngeal exudate.  Eyes: Conjunctivae and EOM are normal. Pupils are equal, round, and reactive to light. No scleral icterus.  Neck: Normal range of motion. Neck supple. No JVD present. No tracheal deviation present. No  thyromegaly present.  Cardiovascular: Normal rate, regular rhythm and normal heart sounds.  Exam reveals no gallop and no friction rub.   No murmur heard. Pulmonary/Chest: Effort normal. No respiratory distress. She has wheezes. She exhibits no tenderness.  Prolonged expiratory phase  Abdominal: Soft. Bowel sounds are normal. She exhibits no distension and no mass. There is no tenderness. There is no rebound and no guarding.  Musculoskeletal: Normal range of motion. She exhibits no edema or tenderness.  Lymphadenopathy:    She has no  cervical adenopathy.  Neurological: She is alert and oriented to person, place, and time. No cranial nerve deficit. She exhibits normal muscle tone.  Skin: Skin is warm and dry. No rash noted. She is not diaphoretic. No erythema. No pallor.  Nursing note and vitals reviewed.   ED Course  Procedures (including critical care time) Labs Review Labs Reviewed - No data to display  Imaging Review Dg Chest 2 View  11/08/2014   CLINICAL DATA:  Cough, congestion and shortness of breath for 2 weeks. History of asthma and bronchitis, smoker.  EXAM: CHEST  2 VIEW  COMPARISON:  Chest radiograph January 21, 2014  FINDINGS: Cardiomediastinal silhouette is unremarkable. The lungs are clear without pleural effusions or focal consolidations. Trachea projects midline and there is no pneumothorax. Soft tissue planes and included osseous structures are non-suspicious. Moderate degenerative change of the thoracic spine.  IMPRESSION: No acute cardiopulmonary process ; stable chest radiograph from January 21, 2014.   Electronically Signed   By: Awilda Metroourtnay  Bloomer M.D.   On: 11/08/2014 06:53     EKG Interpretation None      MDM   Final diagnoses:  Cough    Patient presents to the ED for viral URI like symptoms for the past couple of weeks. She is wheezing on exam. Patient will be given continuous albuterol treatment, ipratropium, prednisone for treatment. Chest x-ray was ordered and is neg.  After breathing treatment I believe patient will be safe for discharge. Her oxygen saturation remained greater than 98% on room air during my assessment, she appears in no acute distress and overall appears well.  Prednisone rx given as well as refill of albuterol inhaler.  PCP fu advised within 3 days.    Tomasita CrumbleAdeleke Eveline Sauve, MD 11/08/14 [phone]

## 2014-11-08 NOTE — Discharge Instructions (Signed)
Bronchospasm Ms. Meece, Take steroids as directed to help with inflammation in your lungs.  Use albuterol inhaler ever 6 hours for 2 days, then as needed.  See a primary doctor within 3 days for close follow up.  If symptoms worsen, come back to the ED immediately. Thank you. A bronchospasm is when the tubes that carry air in and out of your lungs (airways) spasm or tighten. During a bronchospasm it is hard to breathe. This is because the airways get smaller. A bronchospasm can be triggered by:  Allergies. These may be to animals, pollen, food, or mold.  Infection. This is a common cause of bronchospasm.  Exercise.  Irritants. These include pollution, cigarette smoke, strong odors, aerosol sprays, and paint fumes.  Weather changes.  Stress.  Being emotional. HOME CARE   Always have a plan for getting help. Know when to call your doctor and local emergency services (911 in the [place] Know where you can get emergency care.  Only take medicines as told by your doctor.  If you were prescribed an inhaler or nebulizer machine, ask your doctor how to use it correctly. Always use a spacer with your inhaler if you were given one.  Stay calm during an attack. Try to relax and breathe more slowly.  Control your home environment:  Change your heating and air conditioning filter at least once a month.  Limit your use of fireplaces and wood stoves.  Do not  smoke. Do not  allow smoking in your home.  Avoid perfumes and fragrances.  Get rid of pests (such as roaches and mice) and their droppings.  Throw away plants if you see mold on them.  Keep your house clean and dust free.  Replace carpet with wood, tile, or vinyl flooring. Carpet can trap dander and dust.  Use allergy-proof pillows, mattress covers, and box spring covers.  Wash bed sheets and blankets every week in hot water. Dry them in a dryer.  Use blankets that are made of polyester or cotton.  Wash hands  frequently. GET HELP IF:  You have muscle aches.  You have chest pain.  The thick spit you spit or cough up (sputum) changes from clear or white to yellow, green, gray, or bloody.  The thick spit you spit or cough up gets thicker.  There are problems that may be related to the medicine you are given such as:  A rash.  Itching.  Swelling.  Trouble breathing. GET HELP RIGHT AWAY IF:  You feel you cannot breathe or catch your breath.  You cannot stop coughing.  Your treatment is not helping you breathe better.  You have very bad chest pain. MAKE SURE YOU:   Understand these instructions.  Will watch your condition.  Will get help right away if you are not doing well or get worse. Document Released: 03/30/2009 Document Revised: 06/07/2013 Document Reviewed: 11/23/2012 [facility] CenterExit[facility] Patient Information 2015 [place], [place]. This information is not intended to replace advice given to you by your health care provider. Make sure you discuss any questions you have with your health care provider.

## 2014-11-08 NOTE — ED Notes (Signed)
Pt complains of a productive cough, congestion and sinus pressure for two weeks

## 2015-06-03 ENCOUNTER — Emergency Department (HOSPITAL_COMMUNITY)
Admission: EM | Admit: 2015-06-03 | Discharge: 2015-06-03 | Disposition: A | Discharge status: Home / Self Care | Payer: BLUE CROSS/BLUE SHIELD | Attending: Emergency Medicine | Admitting: Emergency Medicine

## 2015-06-03 ENCOUNTER — Encounter (HOSPITAL_COMMUNITY): Payer: Self-pay | Admitting: *Deleted

## 2015-06-03 DIAGNOSIS — Z8719 Personal history of other diseases of the digestive system: Secondary | ICD-10-CM | POA: Diagnosis not present

## 2015-06-03 DIAGNOSIS — Z7952 Long term (current) use of systemic steroids: Secondary | ICD-10-CM | POA: Insufficient documentation

## 2015-06-03 DIAGNOSIS — Z7951 Long term (current) use of inhaled steroids: Secondary | ICD-10-CM | POA: Diagnosis not present

## 2015-06-03 DIAGNOSIS — J019 Acute sinusitis, unspecified: Secondary | ICD-10-CM | POA: Diagnosis not present

## 2015-06-03 DIAGNOSIS — I10 Essential (primary) hypertension: Secondary | ICD-10-CM | POA: Diagnosis not present

## 2015-06-03 DIAGNOSIS — Z79899 Other long term (current) drug therapy: Secondary | ICD-10-CM | POA: Diagnosis not present

## 2015-06-03 DIAGNOSIS — J209 Acute bronchitis, unspecified: Secondary | ICD-10-CM | POA: Diagnosis not present

## 2015-06-03 DIAGNOSIS — R0981 Nasal congestion: Secondary | ICD-10-CM | POA: Diagnosis present

## 2015-06-03 DIAGNOSIS — J40 Bronchitis, not specified as acute or chronic: Secondary | ICD-10-CM

## 2015-06-03 DIAGNOSIS — F17210 Nicotine dependence, cigarettes, uncomplicated: Secondary | ICD-10-CM | POA: Insufficient documentation

## 2015-06-03 DIAGNOSIS — H9209 Otalgia, unspecified ear: Secondary | ICD-10-CM | POA: Insufficient documentation

## 2015-06-03 MED ORDER — IBUPROFEN 600 MG PO TABS: 600.0000 mg | ORAL_TABLET | Freq: Three times a day (TID) | ORAL | Status: DC | PRN

## 2015-06-03 MED ORDER — OXYMETAZOLINE HCL 0.05 % NA SOLN: 1.0000 | Freq: Two times a day (BID) | NASAL | Status: DC | PRN

## 2015-06-03 MED ORDER — BENZONATATE 100 MG PO CAPS: 100.0000 mg | ORAL_CAPSULE | Freq: Three times a day (TID) | ORAL | Status: DC | PRN

## 2015-06-03 MED ORDER — AZITHROMYCIN 250 MG PO TABS: 250.0000 mg | ORAL_TABLET | Freq: Every day | ORAL | Status: DC

## 2015-06-03 MED ORDER — ALBUTEROL SULFATE HFA 108 (90 BASE) MCG/ACT IN AERS
2.0000 | INHALATION_SPRAY | Freq: Once | RESPIRATORY_TRACT | Status: AC
  Administered 2015-06-03: 2 via RESPIRATORY_TRACT
  Filled 2015-06-03: qty 6.7

## 2015-06-03 NOTE — ED Notes (Signed)
Pt c/o nasal congestion, cough, sore muscles x 1 week

## 2015-06-03 NOTE — ED Provider Notes (Signed)
CSN: [id number]     Arrival date & time 06/03/15  1324 History  By signing my name below, I, Evon Slackerrance Branch, attest that this documentation has been prepared under the direction and in the presence of EbroEmily Bennie Chirico, PA-C. Electronically Signed: Evon Slackerrance Branch, ED Scribe. 06/03/2015. 2:08 PM.    Chief Complaint  Patient presents with  . URI   Patient is a 59 y.o. female presenting with URI. The history is provided by the patient. No language interpreter was used.  URI Presenting symptoms: congestion, cough, ear pain, fatigue and rhinorrhea   Presenting symptoms: no fever and no sore throat   Associated symptoms: myalgias   Associated symptoms: no neck pain    HPI Comments: Melanie Guzman is a 59 y.o. female who presents to the Emergency Department complaining of worsening URI symptoms for 2 weeks. Pt is complaining of chills, sinus pressure, rhinorrhea, congestion, ear pain, productive cough of green-blood tinged sputum, fatigue and myalgias. Pt also reports feeling slightly SOB. Pt states that she has tried Flonase with no relief. Pt denies sore throat or fever. Pt does report everyday tobacco use. Pt does report being around family members who had similar symptoms when her symptoms began. Pt reports HX of bronchitis. Pt denies recent long distant travel or immobilization, unilateral leg swelling, hx blood clots. Pt does report some right knee swelling last week as well with hx bad arthritis that is now resolved.    NO PCP.  Past Medical History  Diagnosis Date  . Hypertension   . Chronic bronchitis (HCC)   . Pancreatitis    Past Surgical History  Procedure Laterality Date  . Abdominal hysterectomy      partial   Family History  Problem Relation Age of Onset  . Heart attack Mother   . Stroke Maternal Grandmother   . Cancer Maternal Grandfather     bone  . Pancreatic cancer Paternal Grandmother   . Colon cancer Neg Hx    Social History  Substance Use Topics  . Smoking  status: Current Every Day Smoker -- 1.00 packs/day for 40 years    Types: Cigarettes  . Smokeless tobacco: Never Used  . Alcohol Use: No   OB History    Gravida Para Term Preterm AB TAB SAB Ectopic Multiple Living   2 2 2       2       Review of Systems  Constitutional: Positive for chills and fatigue. Negative for fever.  HENT: Positive for congestion, ear pain, rhinorrhea and sinus pressure. Negative for sore throat and trouble swallowing.   Respiratory: Positive for cough and shortness of breath.   Cardiovascular: Negative for chest pain.  Gastrointestinal: Negative for abdominal pain.  Musculoskeletal: Positive for myalgias. Negative for neck pain and neck stiffness.  Skin: Negative for rash.  Allergic/Immunologic: Negative for immunocompromised state.     Allergies  Review of patient's allergies indicates no known allergies.  Home Medications   Prior to Admission medications   Medication Sig Start Date End Date Taking? Authorizing Provider  amoxicillin-clavulanate (AUGMENTIN) 875-125 MG per tablet Take 1 tablet by mouth 2 (two) times daily. One po bid x 7 days Patient not taking: Reported on 11/08/2014 03/28/14   Purvis SheffieldForrest Harrison, MD  cetirizine-pseudoephedrine (ZYRTEC-D) 5-120 MG per tablet Take 1 tablet by mouth daily. Patient not taking: Reported on 11/08/2014 03/28/14   Purvis SheffieldForrest Harrison, MD  Chlorphen-Phenyleph-ASA (ALKA-SELTZER PLUS COLD PO) Take 2 capsules by mouth 2 (two) times daily as needed (cold symptoms).  Historical Provider, MD  fluticasone (FLONASE) 50 MCG/ACT nasal spray Place 2 sprays into both nostrils daily. 03/28/14   Purvis Sheffield, MD  magnesium citrate SOLN Take 296 mLs (1 Bottle total) by mouth once. 11/08/14   Tomasita Crumble, MD  Multiple Vitamin (MULTIVITAMIN WITH MINERALS) TABS tablet Take 1 tablet by mouth daily.    Historical Provider, MD  ondansetron (ZOFRAN ODT) 4 MG disintegrating tablet  ODT q4 hours prn nausea/vomit Patient not taking:  Reported on 11/08/2014 03/28/14   Purvis Sheffield, MD  predniSONE (DELTASONE) 20 MG tablet Take 3 tablets (60 mg total) by mouth daily. 11/08/14   Tomasita Crumble, MD   BP 136/99 mmHg  Pulse 70  Temp(Src) 98.3 F (36.8 C) (Oral)  Resp 18  SpO2 98%   Physical Exam  Constitutional: She appears well-developed and well-nourished. No distress.  HENT:  Head: Normocephalic and atraumatic.  Nose: Mucosal edema and rhinorrhea present.  Mouth/Throat: Oropharynx is clear and moist. No oropharyngeal exudate.  Nasal mucosal erythematous and edematous with discharge.   Eyes: Conjunctivae are normal.  Neck: Neck supple.  Cardiovascular: Normal rate and regular rhythm.   Pulmonary/Chest: Effort normal and breath sounds normal. No respiratory distress. She has no wheezes. She has no rales.  Coughing during exam.   Neurological: She is alert.  Skin: She is not diaphoretic.  Nursing note and vitals reviewed.   ED Course  Procedures (including critical care time) DIAGNOSTIC STUDIES: Oxygen Saturation is 98% on RA, normal by my interpretation.    COORDINATION OF CARE: 2:45 PM-Discussed treatment plan with pt at bedside and pt agreed to plan.     Labs Review Labs Reviewed - No data to display  Imaging Review No results found.    EKG Interpretation None      MDM   Final diagnoses:  Bronchitis  Acute sinusitis, recurrence not specified, unspecified location    Afebrile, nontoxic patient with constellation of symptoms suggestive of viral syndrome that has become prolonged with secondary worsening.  No known risk factors for PE. No concerning findings on exam.  Discharged home with supportive care, z-pak, PCP follow up. Discussed result, findings, treatment, and follow up  with patient.  Pt given return precautions.  Pt verbalizes understanding and agrees with plan.       I personally performed the services described in this documentation, which was scribed in my presence. The recorded  information has been reviewed and is accurate.     Trixie Dredge, PA-C 06/03/15 1451  Bethann Berkshire, MD 06/04/15 1224

## 2015-06-03 NOTE — Discharge Instructions (Signed)
Read the information below.  Use the prescribed medication as directed.  Please discuss all new medications with your pharmacist.  You may return to the Emergency Department at any time for worsening condition or any new symptoms that concern you.  If you develop high fevers that do not resolve with tylenol or ibuprofen, you have difficulty swallowing or breathing, or you are unable to tolerate fluids by mouth, return to the ER for a recheck.      Sinusitis, Adult Sinusitis is redness, soreness, and inflammation of the paranasal sinuses. Paranasal sinuses are air pockets within the bones of your face. They are located beneath your eyes, in the middle of your forehead, and above your eyes. In healthy paranasal sinuses, mucus is able to drain out, and air is able to circulate through them by way of your nose. However, when your paranasal sinuses are inflamed, mucus and air can become trapped. This can allow bacteria and other germs to grow and cause infection. Sinusitis can develop quickly and last only a short time (acute) or continue over a long period (chronic). Sinusitis that lasts for more than 12 weeks is considered chronic. CAUSES Causes of sinusitis include:  Allergies.  Structural abnormalities, such as displacement of the cartilage that separates your nostrils (deviated septum), which can decrease the air flow through your nose and sinuses and affect sinus drainage.  Functional abnormalities, such as when the small hairs (cilia) that line your sinuses and help remove mucus do not work properly or are not present. SIGNS AND SYMPTOMS Symptoms of acute and chronic sinusitis are the same. The primary symptoms are pain and pressure around the affected sinuses. Other symptoms include:  Upper toothache.  Earache.  Headache.  Bad breath.  Decreased sense of smell and taste.  A cough, which worsens when you are lying flat.  Fatigue.  Fever.  Thick drainage from your nose, which often  is green and may contain pus (purulent).  Swelling and warmth over the affected sinuses. DIAGNOSIS Your health care provider will perform a physical exam. During your exam, your health care provider may perform any of the following to help determine if you have acute sinusitis or chronic sinusitis:  Look in your nose for signs of abnormal growths in your nostrils (nasal polyps).  Tap over the affected sinus to check for signs of infection.  View the inside of your sinuses using an imaging device that has a light attached (endoscope). If your health care provider suspects that you have chronic sinusitis, one or more of the following tests may be recommended:  Allergy tests.  Nasal culture. A sample of mucus is taken from your nose, sent to a lab, and screened for bacteria.  Nasal cytology. A sample of mucus is taken from your nose and examined by your health care provider to determine if your sinusitis is related to an allergy. TREATMENT Most cases of acute sinusitis are related to a viral infection and will resolve on their own within 10 days. Sometimes, medicines are prescribed to help relieve symptoms of both acute and chronic sinusitis. These may include pain medicines, decongestants, nasal steroid sprays, or saline sprays. However, for sinusitis related to a bacterial infection, your health care provider will prescribe antibiotic medicines. These are medicines that will help kill the bacteria causing the infection. Rarely, sinusitis is caused by a fungal infection. In these cases, your health care provider will prescribe antifungal medicine. For some cases of chronic sinusitis, surgery is needed. Generally, these are cases  in which sinusitis recurs more than 3 times per year, despite other treatments. HOME CARE INSTRUCTIONS  Drink plenty of water. Water helps thin the mucus so your sinuses can drain more easily.  Use a humidifier.  Inhale steam 3-4 times a day (for example, sit in  the bathroom with the shower running).  Apply a warm, moist washcloth to your face 3-4 times a day, or as directed by your health care provider.  Use saline nasal sprays to help moisten and clean your sinuses.  Take medicines only as directed by your health care provider.  If you were prescribed either an antibiotic or antifungal medicine, finish it all even if you start to feel better. SEEK IMMEDIATE MEDICAL CARE IF:  You have increasing pain or severe headaches.  You have nausea, vomiting, or drowsiness.  You have swelling around your face.  You have vision problems.  You have a stiff neck.  You have difficulty breathing.   This information is not intended to replace advice given to you by your health care provider. Make sure you discuss any questions you have with your health care provider.   Document Released: 06/02/2005 Document Revised: 06/23/2014 Document Reviewed: 06/17/2011 Elsevier Interactive Patient Education 2016 [employer]    Emergency Department Resource Guide 1) Find a Doctor and Pay Out of Pocket Although you won't have to find out who is covered by your insurance plan, it is a good idea to ask around and get recommendations. You will then need to call the office and see if the doctor you have chosen will accept you as a new patient and what types of options they offer for patients who are self-pay. Some doctors offer discounts or will set up payment plans for their patients who do not have insurance, but you will need to ask so you aren't surprised when you get to your appointment.  2) Contact Your Local Health Department Not all health departments have doctors that can see patients for sick visits, but many do, so it is worth a call to see if yours does. If you don't know where your local health department is, you can check in your phone book. The CDC also has a tool to help you locate your state's health department, and many state websites also have listings  of all of their local health departments.  3) Find a Walk-in Clinic If your illness is not likely to be very severe or complicated, you may want to try a walk in clinic. These are popping up all over the country in pharmacies, drugstores, and shopping centers. They're usually staffed by nurse practitioners or physician assistants that have been trained to treat common illnesses and complaints. They're usually fairly quick and inexpensive. However, if you have serious medical issues or chronic medical problems, these are probably not your best option.  No Primary Care Doctor: - Call Health Connect at  [phone] - they can help you locate a primary care doctor that  accepts your insurance, provides certain services, etc. - Physician Referral Service- [phone]  Chronic Pain Problems: Organization         Address  Phone   Notes  [facility]  [phone] Patients need to be referred by their primary care doctor.   Medication Assistance: Organization         Address  Phone   Notes  [facility] Passistance Program [address] [place], [place] [phone]) [phone] --Must be a resident  of [place] -- Must have NO insurance coverage whatsoever (no Medicaid/ Medicare, etc.) -- The pt. MUST have a primary care doctor that directs their care regularly and follows them in the community   [facility]  [phone]   [employer]  [phone]    Agencies that provide inexpensive medical care: Organization         Address  Phone   Notes  [facility]  [phone]   [facility]    [phone]   [facility] [address] [place], [place] [address] [phone]   [facility] [address] [place], [place] [phone]   Planned Parenthood    [phone]   [facility]    [phone]   [facility]  [address], [place] Phone:  [phone], Fax:  [phone] Hours of Operation:  9 am - 6 pm, M-F.  Also accepts Medicaid/Medicare and self-pay.  [facility]  [address], [place] Phone: [phone], Fax: [phone]. Hours of Operation:  8:30 am - 5:30 pm, M-F.  Also accepts Medicaid and self-pay.  [facility] [place] [address] [place] Phone: [phone]   [facility] [address] Natasha Bence [place], [place] [phone], Ext. [phone] Mondays & Thursdays: 7-9 AM.  First 15 patients are seen on a first come, first serve basis.    Medicaid-accepting [facility] Providers:  Organization         Address  Phone   Notes  [facility] [address], [place] [phone] Also accepts self-pay patients.  [facility] [address] Laurell Josephs [place], [place]  [phone]   [facility] [address], [place] [phone]   [facility] [address], [place] [phone]   Renaye Rakers [address], [place]   [phone] Only accepts [place] Access [place] patients after they have their name applied to their card.   Self-Pay (no insurance) in [facility]:  Organization         Address  Phone   Notes  Sickle Cell Patients, [facility] [address] [place], [place] [phone]   [facility] [address] [place], [place] [phone]   [facility] [place]  [address], [place] [phone]   [facility]/Dr. Osei-Bonsu  [address], [place] or [address], [place] [phone] Phone number for both [place] and [place] locations is the same.  [facility] [address] [place] [phone]   [facility] [address] [place] or [address] [phone]   [facility] [address] [place] [phone], phone; [phone], fax Sees patients 1st and 3rd Saturday of every month.  Must not qualify for public or private insurance (i.e. Medicaid, Medicare, [place] Health Choice, Veterans' Benefits)  Household income should be no more than 200% of the poverty level The clinic cannot treat you if you are pregnant or think you are pregnant  Sexually transmitted diseases are not treated at the clinic.    Dental Care: Organization         Address  Phone  Notes  [facility] Department of [facility] [place], [place] [phone] Accepts children up to age 77 who are enrolled in [place] or [place]; pregnant women with a Medicaid card; and children who have applied for Medicaid or [place] Health Choice, but were declined, whose parents can pay a reduced fee at time of service.  [facility] Department of [facility]  [address], [place] [phone] Accepts children up to age 33 who are enrolled in [place] or [place]; pregnant women with a Medicaid card; and children who have applied for Medicaid or [place] Health Choice, but were declined, whose parents can pay a reduced fee at time of service.  [facility]  [place] [phone] Patients are seen by appointment only. Walk-ins are not accepted. [place] will see patients 26 years of age and older. Monday - Tuesday (8am-5pm) Most Wednesdays (8:30-5pm) $30 per visit, cash only  [facility]  [address], [facility] [phone] Patients are seen by appointment only. Walk-ins are not accepted. [place] will see patients 36 years of age and older. One Wednesday Evening (Monthly: Volunteer Based).  $30 per visit, cash only  [place]   [phone] for adults; Children under age 23, call Graduate Pediatric Dentistry at [phone]. Children aged 29-14, please call [phone] to request a pediatric application.  Dental services are provided in all areas of dental care including fillings, crowns and bridges, complete and partial dentures, implants, gum treatment, root canals, and extractions. Preventive care is also provided. Treatment is provided to both adults and children. Patients are selected via a lottery and there is often a waiting list.   [facility] [address], [place]  [phone] [email or web address]   [facility] [address] [place], [place] [phone], Ext. [phone] Second and Fourth Thursday of each month, opens at 6:30 AM; Clinic ends at 9 AM.  Patients are seen on a first-come first-served basis, and a limited number are seen during each clinic.   [facility]  [address] Hillard Danker [place], [place] [phone]   Eligibility Requirements You must have lived in [place], [place], or [place] counties for at least the last three months.   You cannot be eligible for state or federal sponsored [employer], including [employer], [place], or [employer].   You generally cannot be eligible for healthcare insurance through your employer.    How to apply: Eligibility screenings are held every Tuesday and Wednesday afternoon from 1:00 pm until 4:00 pm. You do not need an appointment for the interview!  [facility] [address], [place], [place]   [place]  [place] Department  [place]  [phone]    Behavioral Health Resources in the Community: Intensive Outpatient Programs Organization         Address  Phone  Notes  [place]. [address], [place], [place] [phone]   [facility] Outpatient [address] [place], [place]   [facility]: [facility] [address] [place], [place]   [place] [address]  [place], [place] or [phone]   Substance Abuse Resources Organization  Address  Phone  Notes  [facility] Drug Services  [phone]   [place]  [phone]   [facility] [place]  [phone]   Chinita Pester  [phone]   Residential & Outpatient Substance Abuse Program  [phone]   Psychological Services Organization         Address  Phone  Notes  [facility] [place]  [place]  [phone]   [place] [address], [place] or [phone]    Mobile Crisis Teams Organization         Address  Phone  Notes  [facility], Mobile Crisis Care Unit  [phone]   [facility]  [address] [place], [place]   [place] [address], [place] [phone]    Self-Help/Support Groups Organization         Address  Phone             Notes  [place] [facility] [place] - variety of support groups  [place] Call for more information  Narcotics Anonymous (NA), [facility] [address], [employer] [place]  2 meetings at this location   Special educational needs teacher         Address  Phone  Notes  [facility] [place],    [place]  [phone]   [facility]  [address], [place] [phone], [place], [place]   [place], [place] [phone] Admissions: 8am-3pm M-F  Incentives Substance [place] [address]    [place], [place] [phone]   [facility] [address] [place], [place], [place]   [facility] [address],  [place], [place]     [facility] - Intensive Outpatient [address] Kristeen [address], [place], [place]   [facility] ([place].,  [place], [place] [phone] or [phone]   [facility] ([facility]) [address], [place], [place] Accepts Medicaid  [facility] [place] [address]  [place] [phone] Substance Abuse/Addiction Treatment   [facility] Organization         Address  Phone  Notes  [facility]  [phone]   Domenic Schwab, PhD [address] Arlis Porta [place], [place]   [phone] or [phone]   [facility] [place], [place] [phone]   [facility] [address], [place], [place] [phone] Insurance/Medicaid/sponsorship through [facility] and Families [address] [place]                                    [place], [place] [phone] [place] [address], [place] [phone]    Dr. Adele Schilder  [phone]   [facility] Dept. 1) [address] [place] 2) [place] 3)  [place] [address], [place] [phone]  [phone]   [place] [phone] or [phone] (After Hours)

## 2015-08-10 IMAGING — CR DG CHEST 2V
2 series · 2 of 2 positions shown · non-contrast
Comparison: 07/02/2012

CLINICAL DATA: Cough and fever

CHEST - 2 VIEW

[w chest pa]
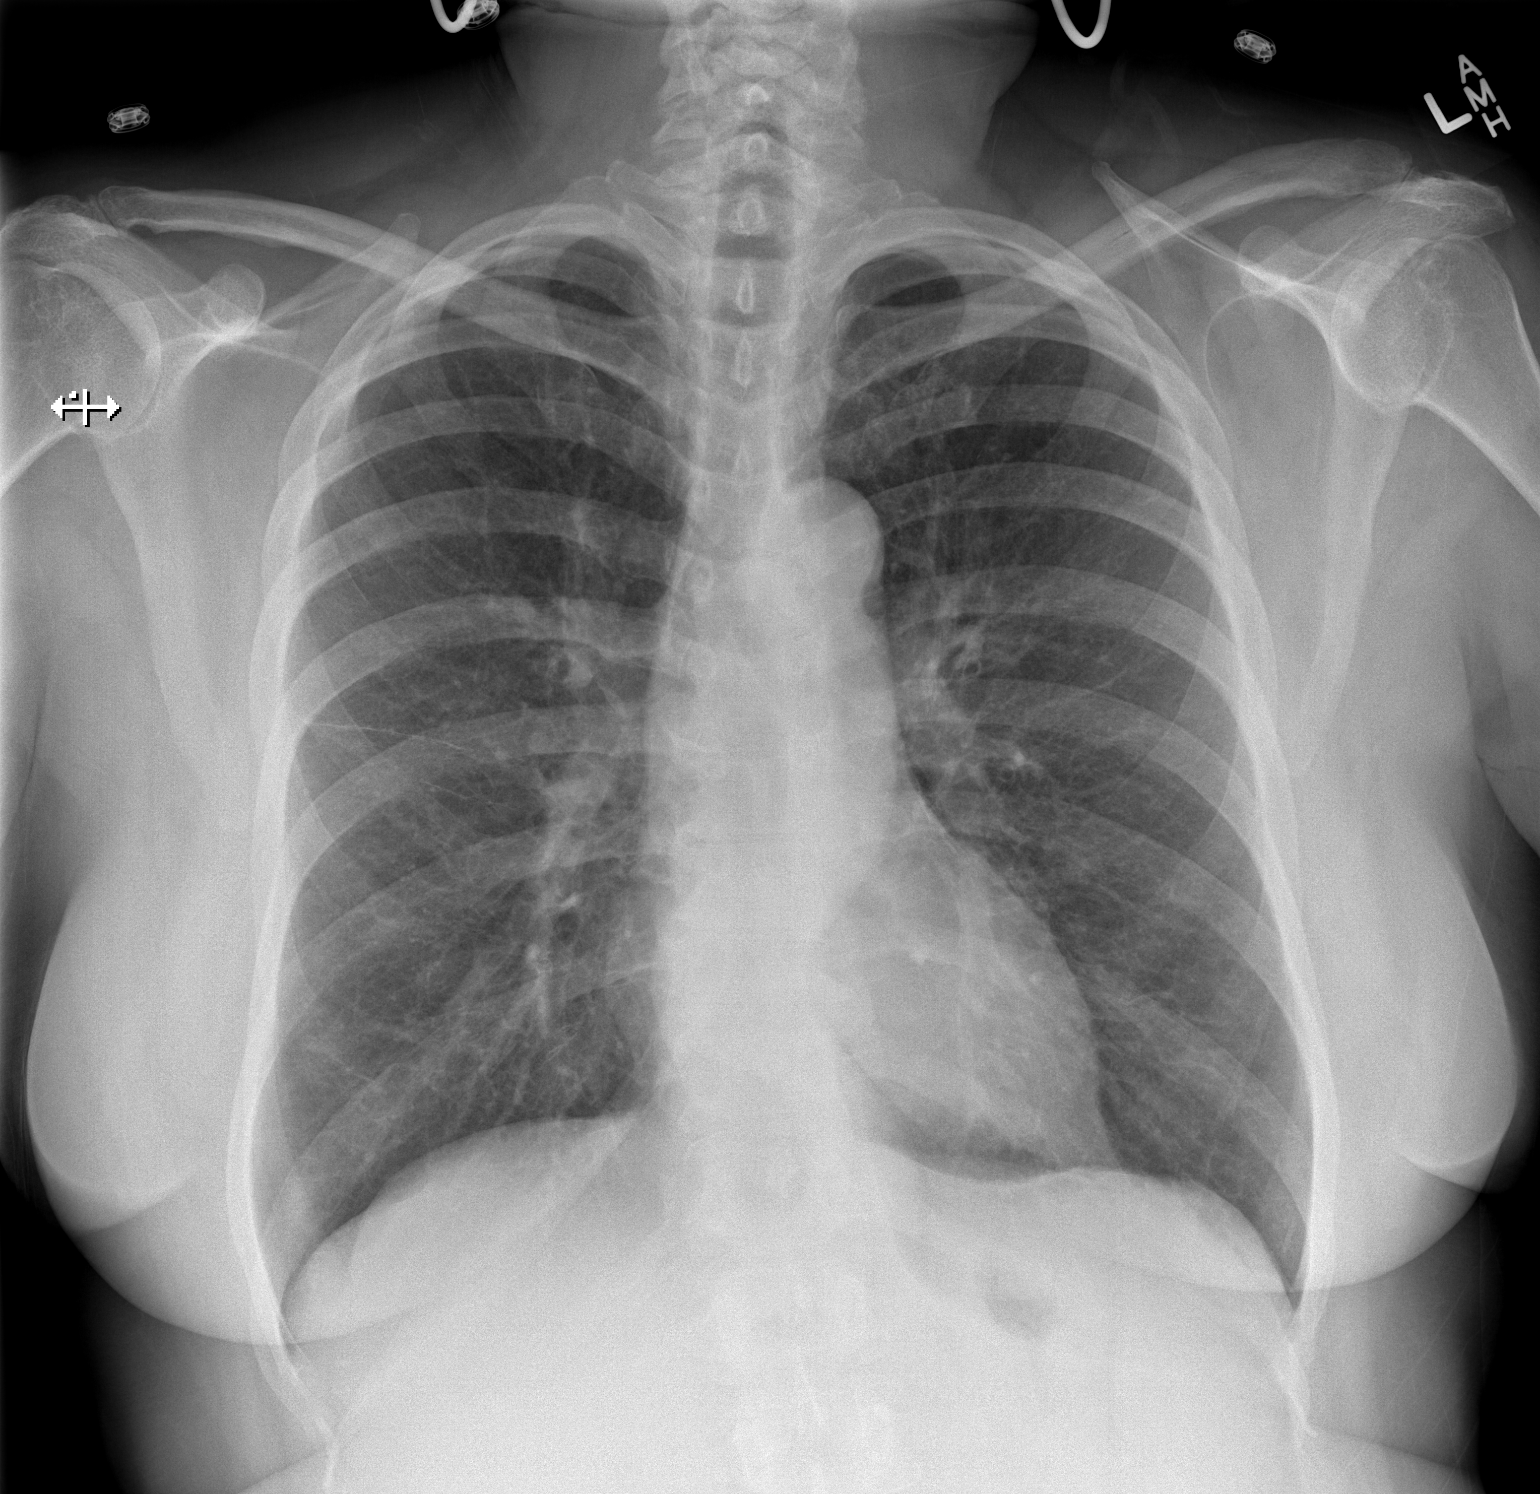

[w chest lat]
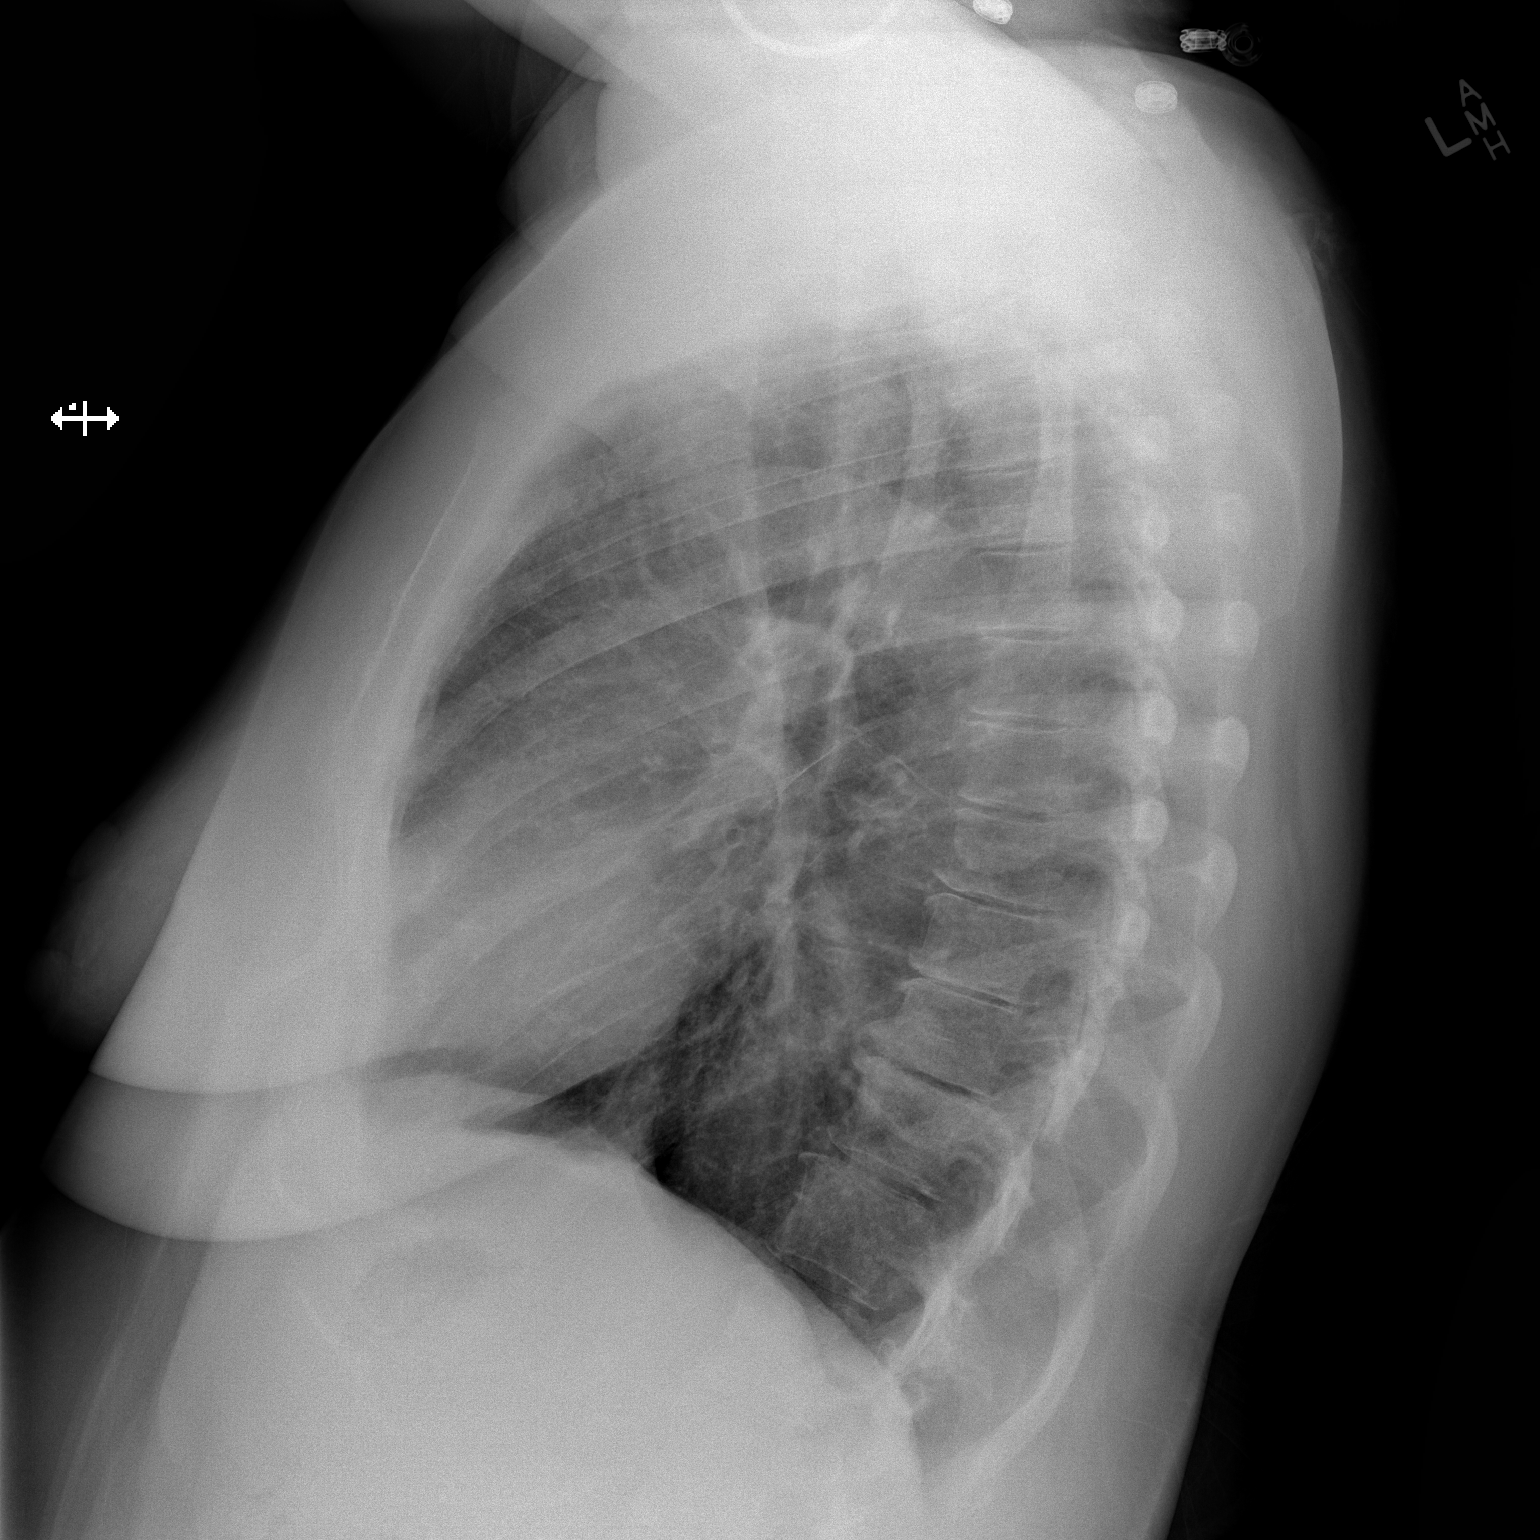

[2 of 2 positions shown; findings below may reference images not displayed]

FINDINGS: Hyperexpansion is consistent with emphysema. Interstitial
markings are diffusely coarsened with chronic features. The lungs
are clear without focal infiltrate, edema, pneumothorax or pleural
effusion. The cardiopericardial silhouette is within normal limits
for size. Imaged bony structures of the thorax are intact.
IMPRESSION: Emphysema without acute cardiopulmonary findings.

## 2015-08-15 ENCOUNTER — Emergency Department (HOSPITAL_COMMUNITY)
Admission: EM | Admit: 2015-08-15 | Discharge: 2015-08-15 | Disposition: A | Discharge status: Home / Self Care | Payer: BLUE CROSS/BLUE SHIELD | Attending: Emergency Medicine | Admitting: Emergency Medicine

## 2015-08-15 ENCOUNTER — Encounter (HOSPITAL_COMMUNITY): Payer: Self-pay | Admitting: Emergency Medicine

## 2015-08-15 DIAGNOSIS — M549 Dorsalgia, unspecified: Secondary | ICD-10-CM | POA: Insufficient documentation

## 2015-08-15 DIAGNOSIS — I10 Essential (primary) hypertension: Secondary | ICD-10-CM | POA: Diagnosis not present

## 2015-08-15 DIAGNOSIS — H53149 Visual discomfort, unspecified: Secondary | ICD-10-CM | POA: Diagnosis not present

## 2015-08-15 DIAGNOSIS — F17210 Nicotine dependence, cigarettes, uncomplicated: Secondary | ICD-10-CM | POA: Insufficient documentation

## 2015-08-15 DIAGNOSIS — R51 Headache: Secondary | ICD-10-CM | POA: Diagnosis present

## 2015-08-15 DIAGNOSIS — Z8719 Personal history of other diseases of the digestive system: Secondary | ICD-10-CM | POA: Diagnosis not present

## 2015-08-15 DIAGNOSIS — H578 Other specified disorders of eye and adnexa: Secondary | ICD-10-CM | POA: Insufficient documentation

## 2015-08-15 DIAGNOSIS — Z79899 Other long term (current) drug therapy: Secondary | ICD-10-CM | POA: Insufficient documentation

## 2015-08-15 DIAGNOSIS — J019 Acute sinusitis, unspecified: Secondary | ICD-10-CM | POA: Diagnosis not present

## 2015-08-15 HISTORY — DX: Dorsalgia, unspecified: M54.9

## 2015-08-15 MED ORDER — AZITHROMYCIN 250 MG PO TABS: 250.0000 mg | ORAL_TABLET | Freq: Every day | ORAL | Status: DC

## 2015-08-15 MED ORDER — KETOROLAC TROMETHAMINE 60 MG/2ML IM SOLN
60.0000 mg | Freq: Once | INTRAMUSCULAR | Status: AC
  Administered 2015-08-15: 60 mg via INTRAMUSCULAR
  Filled 2015-08-15: qty 2

## 2015-08-15 NOTE — Discharge Instructions (Signed)

## 2015-08-15 NOTE — ED Provider Notes (Signed)
CSN: [id number]     Arrival date & time 08/15/15  2052 History  By signing my name below, I, Budd Palmer, attest that this documentation has been prepared under the direction and in the presence of Eber Hong, MD. Electronically Signed: Budd Palmer, ED Scribe. 08/15/2015. 11:38 PM.     Chief Complaint  Patient presents with  . Headache  . Back Pain   The history is provided by the patient. No language interpreter was used.   HPI Comments: Melanie Guzman is a 60 y.o. female smoker at 1 ppd with a PMHx of HTN, chronic bronchitis, back pain, and pancreatitis who presents to the Emergency Department complaining of constant, worsening, frontal headache and lower back pain radiating down the right leg onset 1 week ago. She reports associated chills, photophobia, post-nasal drip, sinus pressure, watery eyes, rhinorrhea, scratchy throat, cough, and one day of fever (Tmax 100.4) that occurred 4 days ago and has since resolved. She notes a PMHx of sinus infections. She denies getting a flu shot this year. Pt denies sneezing and eye itching.   Past Medical History  Diagnosis Date  . Hypertension   . Chronic bronchitis (HCC)   . Pancreatitis   . Back pain    Past Surgical History  Procedure Laterality Date  . Abdominal hysterectomy      partial   Family History  Problem Relation Age of Onset  . Heart attack Mother   . Stroke Maternal Grandmother   . Cancer Maternal Grandfather     bone  . Pancreatic cancer Paternal Grandmother   . Colon cancer Neg Hx    Social History  Substance Use Topics  . Smoking status: Current Every Day Smoker -- 1.00 packs/day for 40 years    Types: Cigarettes  . Smokeless tobacco: Never Used  . Alcohol Use: No   OB History    Gravida Para Term Preterm AB TAB SAB Ectopic Multiple Living   Review of Systems  Constitutional: Positive for fever and chills.  HENT: Positive for postnasal drip, rhinorrhea, sinus pressure and sore  throat. Negative for sneezing.   Eyes: Positive for photophobia and discharge. Negative for itching.  Respiratory: Positive for cough.   Musculoskeletal: Positive for back pain.  Neurological: Positive for headaches.  All other systems reviewed and are negative.   Allergies  Review of patient's allergies indicates no known allergies.  Home Medications   Prior to Admission medications   Medication Sig Start Date End Date Taking? Authorizing Provider  ibuprofen (ADVIL,MOTRIN) 600 MG tablet Take 1 tablet (600 mg total) by mouth every 8 (eight) hours as needed. 06/03/15  Yes Trixie Dredge, PA-C  oxymetazoline (AFRIN NASAL SPRAY) 0.05 % nasal spray Place 1 spray into both nostrils 2 (two) times daily as needed for congestion. X 3 days only 06/03/15  Yes Trixie Dredge, PA-C  sodium-potassium bicarbonate (ALKA-SELTZER GOLD) TBEF dissolvable tablet Take 1 tablet by mouth daily.   Yes Historical Provider, MD  azithromycin (ZITHROMAX Z-PAK) 250 MG tablet Take 1 tablet (250 mg total) by mouth daily.  PO day 1, then  PO days 205 08/15/15   Eber Hong, MD   BP 140/78 mmHg  Pulse 81  Temp(Src) 100 F (37.8 C) (Oral)  Resp 18  Ht  (1.727 m)  Wt 216 lb (97.977 kg)  BMI 32.85 kg/m2  SpO2 97% Physical Exam  Constitutional: She appears well-developed and well-nourished.  HENT:  Head: Normocephalic and atraumatic.  Rhinorrhea, pt sounds nasal, redness in the pahrynx  Eyes: Conjunctivae are normal. Right eye exhibits no discharge. Left eye exhibits no discharge.  Pulmonary/Chest: Effort normal. No respiratory distress. She has wheezes.  Mild inspiratory and expiratory wheeze  Neurological: She is alert. Coordination normal.  Skin: Skin is warm and dry. No rash noted. She is not diaphoretic. No erythema.  Psychiatric: She has a normal mood and affect.  Nursing note and vitals reviewed.   ED Course  Procedures  DIAGNOSTIC STUDIES: Oxygen Saturation is 97% on RA, adequate by my  interpretation.    COORDINATION OF CARE: 11:34 PM - Discussed probable sinus infection and plans to order a Z-pack. Pt advised of plan for treatment and pt agrees.  Labs Review Labs Reviewed - No data to display  Imaging Review No results found. I have personally reviewed and evaluated these images and lab results as part of my medical decision-making.    MDM   Final diagnoses:  Acute sinusitis, recurrence not specified, unspecified location    Has URI / sinusitis sx, normal neuro - borderline fever, no tachy, no hypoxia, normotensive.  meds given as below - home with Z pak - pt in agreement.  Meds given in ED:  Medications  ketorolac (TORADOL) injection 60 mg (not administered)    New Prescriptions   AZITHROMYCIN (ZITHROMAX Z-PAK) 250 MG TABLET    Take 1 tablet (250 mg total) by mouth daily.  PO day 1, then  PO days 205    I personally performed the services described in this documentation, which was scribed in my presence. The recorded information has been reviewed and is accurate.    Eber Hong, MD 08/15/15 [phone]

## 2015-08-15 NOTE — ED Notes (Signed)
Pt states she has had chills, nausea, headache, and low back pain that radiates down her left leg  Pt states she has had these sxs for about a week

## 2016-04-20 ENCOUNTER — Encounter (HOSPITAL_COMMUNITY): Payer: Self-pay | Admitting: Emergency Medicine

## 2016-04-20 ENCOUNTER — Emergency Department (HOSPITAL_COMMUNITY): Payer: BLUE CROSS/BLUE SHIELD

## 2016-04-20 ENCOUNTER — Emergency Department (HOSPITAL_COMMUNITY)
Admission: EM | Admit: 2016-04-20 | Discharge: 2016-04-20 | Disposition: A | Discharge status: Home / Self Care | Payer: BLUE CROSS/BLUE SHIELD | Attending: Emergency Medicine | Admitting: Emergency Medicine

## 2016-04-20 DIAGNOSIS — Z79899 Other long term (current) drug therapy: Secondary | ICD-10-CM | POA: Diagnosis not present

## 2016-04-20 DIAGNOSIS — F17210 Nicotine dependence, cigarettes, uncomplicated: Secondary | ICD-10-CM | POA: Insufficient documentation

## 2016-04-20 DIAGNOSIS — B349 Viral infection, unspecified: Secondary | ICD-10-CM | POA: Diagnosis not present

## 2016-04-20 DIAGNOSIS — I10 Essential (primary) hypertension: Secondary | ICD-10-CM | POA: Diagnosis not present

## 2016-04-20 DIAGNOSIS — R079 Chest pain, unspecified: Secondary | ICD-10-CM | POA: Diagnosis present

## 2016-04-20 LAB — I-STAT TROPONIN, ED: TROPONIN I, POC: 0 ng/mL (ref 0.00–0.08)

## 2016-04-20 LAB — LIPASE, BLOOD: Lipase: 41 U/L (ref 11–51)

## 2016-04-20 LAB — COMPREHENSIVE METABOLIC PANEL
ALBUMIN: 4.2 g/dL (ref 3.5–5.0)
ALK PHOS: 56 U/L (ref 38–126)
ALT: 46 U/L (ref 14–54)
ANION GAP: 10 (ref 5–15)
AST: 42 U/L — AB (ref 15–41)
BUN: 8 mg/dL (ref 6–20)
CALCIUM: 10.2 mg/dL (ref 8.9–10.3)
CO2: 26 mmol/L (ref 22–32)
Chloride: 104 mmol/L (ref 101–111)
Creatinine, Ser: 0.72 mg/dL (ref 0.44–1.00)
GFR calc Af Amer: >60 mL/min (ref >60)
GFR calc non Af Amer: >60 mL/min (ref >60)
GLUCOSE: 161 mg/dL — AB (ref 65–99)
Potassium: 3.7 mmol/L (ref 3.5–5.1)
Sodium: 140 mmol/L (ref 135–145)
Total Bilirubin: 0.7 mg/dL (ref 0.3–1.2)
Total Protein: 8 g/dL (ref 6.5–8.1)

## 2016-04-20 LAB — URINALYSIS, ROUTINE W REFLEX MICROSCOPIC
Bilirubin Urine: NEGATIVE
GLUCOSE, UA: NEGATIVE mg/dL
HGB URINE DIPSTICK: NEGATIVE
KETONES UR: NEGATIVE mg/dL
Leukocytes, UA: NEGATIVE
Nitrite: NEGATIVE
PH: 6 (ref 5.0–8.0)
PROTEIN: NEGATIVE mg/dL
Specific Gravity, Urine: 1.01 (ref 1.005–1.030)

## 2016-04-20 LAB — CBC
HCT: 40.6 % (ref 36.0–46.0)
HEMOGLOBIN: 13.8 g/dL (ref 12.0–15.0)
MCH: 29.8 pg (ref 26.0–34.0)
MCHC: 34 g/dL (ref 30.0–36.0)
MCV: 87.7 fL (ref 78.0–100.0)
Platelets: 198 10*3/uL (ref 150–400)
RBC: 4.63 MIL/uL (ref 3.87–5.11)
RDW: 13.6 % (ref 11.5–15.5)
WBC: 7.4 10*3/uL (ref 4.0–10.5)

## 2016-04-20 MED ORDER — PROMETHAZINE HCL 25 MG PO TABS: 25.0000 mg | ORAL_TABLET | Freq: Three times a day (TID) | ORAL | 0 refills | Status: DC | PRN

## 2016-04-20 MED ORDER — ONDANSETRON 8 MG PO TBDP
8.0000 mg | ORAL_TABLET | Freq: Once | ORAL | Status: AC
  Administered 2016-04-20: 8 mg via ORAL
  Filled 2016-04-20: qty 1

## 2016-04-20 NOTE — ED Triage Notes (Signed)
Pt reports CP, nausea, generalized body pain, HA, and lower back pain that radiates down L leg. CP began a week ago, feeling of SOB (speaking in full sentences), intermittent palpitations, and dizziness.

## 2016-04-20 NOTE — ED Provider Notes (Signed)
[facility]-EMERGENCY DEPT Provider Note   CSN: [id number] Arrival date & time: 04/20/16  1559     History   Chief Complaint Chief Complaint  Patient presents with  . Chest Pain  . Generalized Body Aches    HPI Mia Creekatrice Egloff is a 60 y.o. female.  HPI Patient presents with chest pain nausea body pain headache she's had for last week. Also had some back pain that goes down the leg. Has a little shortness of breath. States she just feels bad all over. Has a history chronic bronchitis and pancreatitis. She continues to smoke. Has had some sick contacts. States she aches all over. No chest pain. Has a little bit of cough is minimally productive. States she is nauseous but has not been vomiting.  Past Medical History:  Diagnosis Date  . Back pain   . Chronic bronchitis (HCC)   . Hypertension   . Pancreatitis     Patient Active Problem List   Diagnosis Date Noted  . Healthcare maintenance 11/30/2012  . Back pain 11/30/2012  . Depression 11/30/2012  . Abdominal pain, epigastric 11/30/2012  . RUQ abdominal pain 11/30/2012  . Tobacco abuse 11/30/2012    Past Surgical History:  Procedure Laterality Date  . ABDOMINAL HYSTERECTOMY     partial    OB History    Gravida Para Term Preterm AB Living   2 2 2     2    SAB TAB Ectopic Multiple Live Births                   Home Medications    Prior to Admission medications   Medication Sig Start Date End Date Taking? Authorizing Provider  ibuprofen (ADVIL,MOTRIN) 600 MG tablet Take 1 tablet (600 mg total) by mouth every 8 (eight) hours as needed. Patient taking differently: Take 600 mg by mouth every 8 (eight) hours as needed for moderate pain.  06/03/15  Yes Trixie DredgeEmily West, PA-C  promethazine (PHENERGAN) 25 MG tablet Take 1 tablet (25 mg total) by mouth every 8 (eight) hours as needed for nausea or vomiting. 04/20/16   Benjiman CoreNathan [place], MD    Family History Family History  Problem Relation Age of Onset  . Heart attack Mother   .  Stroke Maternal Grandmother   . Cancer Maternal Grandfather     bone  . Pancreatic cancer Paternal Grandmother   . Colon cancer Neg Hx     Social History Social History  Substance Use Topics  . Smoking status: Current Every Day Smoker    Packs/day: 1.00    Years: 40.00    Types: Cigarettes  . Smokeless tobacco: Never Used  . Alcohol use No     Allergies   Patient has no known allergies.   Review of Systems Review of Systems  Constitutional: Positive for appetite change and chills.  HENT: Positive for congestion.   Respiratory: Positive for cough and shortness of breath.   Cardiovascular: Positive for chest pain.  Gastrointestinal: Positive for abdominal pain and nausea. Negative for vomiting.  Endocrine: Negative for polyuria.  Genitourinary: Negative for dyspareunia and dysuria.  Musculoskeletal: Positive for back pain.  Neurological: Negative for headaches.  Hematological: Negative for adenopathy.  Psychiatric/Behavioral: Negative for confusion.     Physical Exam Updated Vital Signs BP 141/89   Pulse 69   Temp 98.4 F (36.9 C) (Oral)   Resp 15   SpO2 98%   Physical Exam  Constitutional: She appears well-developed.  HENT:  Head: Atraumatic.  Eyes: EOM  are normal.  Neck: Neck supple.  Cardiovascular: Normal rate.   Pulmonary/Chest: Effort normal.  Mildly harsh breath sounds without focal wheezes or rales.  Abdominal: Soft. There is no tenderness.  Musculoskeletal: She exhibits no edema.  Neurological: She is alert.  Skin: Skin is warm.  Psychiatric: She has a normal mood and affect.     ED Treatments / Results  Labs (all labs ordered are listed, but only abnormal results are displayed) Labs Reviewed  COMPREHENSIVE METABOLIC PANEL - Abnormal; Notable for the following:       Result Value   Glucose, Bld 161 (*)    AST 42 (*)    All other components within normal limits  CBC  LIPASE, BLOOD  URINALYSIS, ROUTINE W REFLEX MICROSCOPIC (NOT [facility])   I-STAT TROPOININ, ED    EKG  EKG Interpretation  Date/Time:  Sunday April 20 2016 16:15:16 EST Ventricular Rate:  68 PR Interval:    QRS Duration: 103 QT Interval:  408 QTC Calculation: 434 R Axis:   66 Text Interpretation:  Sinus rhythm No STEMI. Similar to prior.  Confirmed by LONG MD, JOSHUA [phone]) on 04/20/2016 4:17:34 PM Also confirmed by LONG MD, JOSHUA [phone]), editor [place], Jola BabinskiMarilyn [phone])  on 04/20/2016 4:21:58 PM       Radiology Dg Chest 2 View  Result Date: 04/20/2016 CLINICAL DATA:  Headache, cough and chest pain for 2 weeks. EXAM: CHEST  2 VIEW COMPARISON:  11/08/2014 FINDINGS: The cardiac silhouette, mediastinal and hilar contours are within normal limits and stable. There are chronic bronchitic changes, likely related to smoking. No infiltrates, edema or effusions. The bony thorax is intact. IMPRESSION: Chronic bronchitic changes related to smoking. No definite infiltrates or effusions. Electronically Signed   By: Rudie MeyerP.  Gallerani M.D.   On: 04/20/2016 17:34    Procedures Procedures (including critical care time)  Medications Ordered in ED Medications  ondansetron (ZOFRAN-ODT) disintegrating tablet 8 mg (8 mg Oral Given 04/20/16 1904)     Initial Impression / Assessment and Plan / ED Course  I have reviewed the triage vital signs and the nursing notes.  Pertinent labs & imaging results that were available during my care of the patient were reviewed by me and considered in my medical decision making (see chart for details).  Clinical Course     Patient with multiple complaints. Likely viral infection. Labs reassuring. Feels better and is tolerating orals after antibiotics. Labs and x-rays reassuring. Discharge home.  Final Clinical Impressions(s) / ED Diagnoses   Final diagnoses:  Viral infection    New Prescriptions New Prescriptions   PROMETHAZINE (PHENERGAN) 25 MG TABLET    Take 1 tablet (25 mg total) by mouth every 8 (eight) hours as needed for  nausea or vomiting.     Benjiman CoreNathan Thaddius Manes, MD 04/20/16 2015

## 2016-04-20 NOTE — Discharge Instructions (Signed)
Use the Phenergan to help with her nausea. Try and keep yourself hydrated. Follow up with a primary care doctor .

## 2016-05-07 ENCOUNTER — Emergency Department (HOSPITAL_COMMUNITY)
Admission: EM | Admit: 2016-05-07 | Discharge: 2016-05-07 | Disposition: A | Discharge status: Home / Self Care | Payer: BLUE CROSS/BLUE SHIELD | Attending: Emergency Medicine | Admitting: Emergency Medicine

## 2016-05-07 ENCOUNTER — Encounter (HOSPITAL_COMMUNITY): Payer: Self-pay | Admitting: Nurse Practitioner

## 2016-05-07 DIAGNOSIS — F17210 Nicotine dependence, cigarettes, uncomplicated: Secondary | ICD-10-CM | POA: Insufficient documentation

## 2016-05-07 DIAGNOSIS — Z79899 Other long term (current) drug therapy: Secondary | ICD-10-CM | POA: Diagnosis not present

## 2016-05-07 DIAGNOSIS — Y999 Unspecified external cause status: Secondary | ICD-10-CM | POA: Diagnosis not present

## 2016-05-07 DIAGNOSIS — Y939 Activity, unspecified: Secondary | ICD-10-CM | POA: Diagnosis not present

## 2016-05-07 DIAGNOSIS — I10 Essential (primary) hypertension: Secondary | ICD-10-CM | POA: Insufficient documentation

## 2016-05-07 DIAGNOSIS — Y92410 Unspecified street and highway as the place of occurrence of the external cause: Secondary | ICD-10-CM | POA: Insufficient documentation

## 2016-05-07 DIAGNOSIS — M545 Low back pain: Secondary | ICD-10-CM | POA: Diagnosis present

## 2016-05-07 DIAGNOSIS — M7918 Myalgia, other site: Secondary | ICD-10-CM

## 2016-05-07 MED ORDER — IBUPROFEN 200 MG PO TABS
600.0000 mg | ORAL_TABLET | Freq: Once | ORAL | Status: AC
  Administered 2016-05-07: 600 mg via ORAL
  Filled 2016-05-07: qty 3

## 2016-05-07 MED ORDER — IBUPROFEN 600 MG PO TABS: 600.0000 mg | ORAL_TABLET | Freq: Four times a day (QID) | ORAL | 0 refills | Status: DC | PRN

## 2016-05-07 MED ORDER — METHOCARBAMOL 500 MG PO TABS
500.0000 mg | ORAL_TABLET | Freq: Once | ORAL | Status: AC
  Administered 2016-05-07: 500 mg via ORAL
  Filled 2016-05-07: qty 1

## 2016-05-07 MED ORDER — METHOCARBAMOL 500 MG PO TABS: 500.0000 mg | ORAL_TABLET | Freq: Two times a day (BID) | ORAL | 0 refills | Status: DC
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
## 2016-05-18 ENCOUNTER — Emergency Department (HOSPITAL_COMMUNITY)
Admission: EM | Admit: 2016-05-18 | Discharge: 2016-05-18 | Disposition: A | Discharge status: Home / Self Care | Payer: BLUE CROSS/BLUE SHIELD | Attending: Emergency Medicine | Admitting: Emergency Medicine

## 2016-05-18 ENCOUNTER — Encounter (HOSPITAL_COMMUNITY): Payer: Self-pay | Admitting: Nurse Practitioner

## 2016-05-18 DIAGNOSIS — F17210 Nicotine dependence, cigarettes, uncomplicated: Secondary | ICD-10-CM | POA: Diagnosis not present

## 2016-05-18 DIAGNOSIS — S161XXD Strain of muscle, fascia and tendon at neck level, subsequent encounter: Secondary | ICD-10-CM | POA: Diagnosis not present

## 2016-05-18 DIAGNOSIS — Z79899 Other long term (current) drug therapy: Secondary | ICD-10-CM | POA: Insufficient documentation

## 2016-05-18 DIAGNOSIS — S3992XD Unspecified injury of lower back, subsequent encounter: Secondary | ICD-10-CM | POA: Diagnosis present

## 2016-05-18 DIAGNOSIS — S39012D Strain of muscle, fascia and tendon of lower back, subsequent encounter: Secondary | ICD-10-CM | POA: Diagnosis not present

## 2016-05-18 DIAGNOSIS — I10 Essential (primary) hypertension: Secondary | ICD-10-CM | POA: Diagnosis not present

## 2016-05-18 MED ORDER — METHOCARBAMOL 500 MG PO TABS
500.0000 mg | ORAL_TABLET | Freq: Two times a day (BID) | ORAL | 0 refills | Status: DC
Start: 2016-05-18 — End: 2017-02-19

## 2016-05-18 MED ORDER — IBUPROFEN 600 MG PO TABS: 600.0000 mg | ORAL_TABLET | Freq: Four times a day (QID) | ORAL | 0 refills | Status: DC | PRN
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
## 2016-07-30 IMAGING — CR DG CHEST 2V
2 series · 2 of 2 positions shown · non-contrast
Comparison: 09/25/2013 and prior chest radiographs dating back to
05/26/2004

CLINICAL DATA: Left chest pain and cough.

EXAM:
CHEST  2 VIEW

[w chest pa]
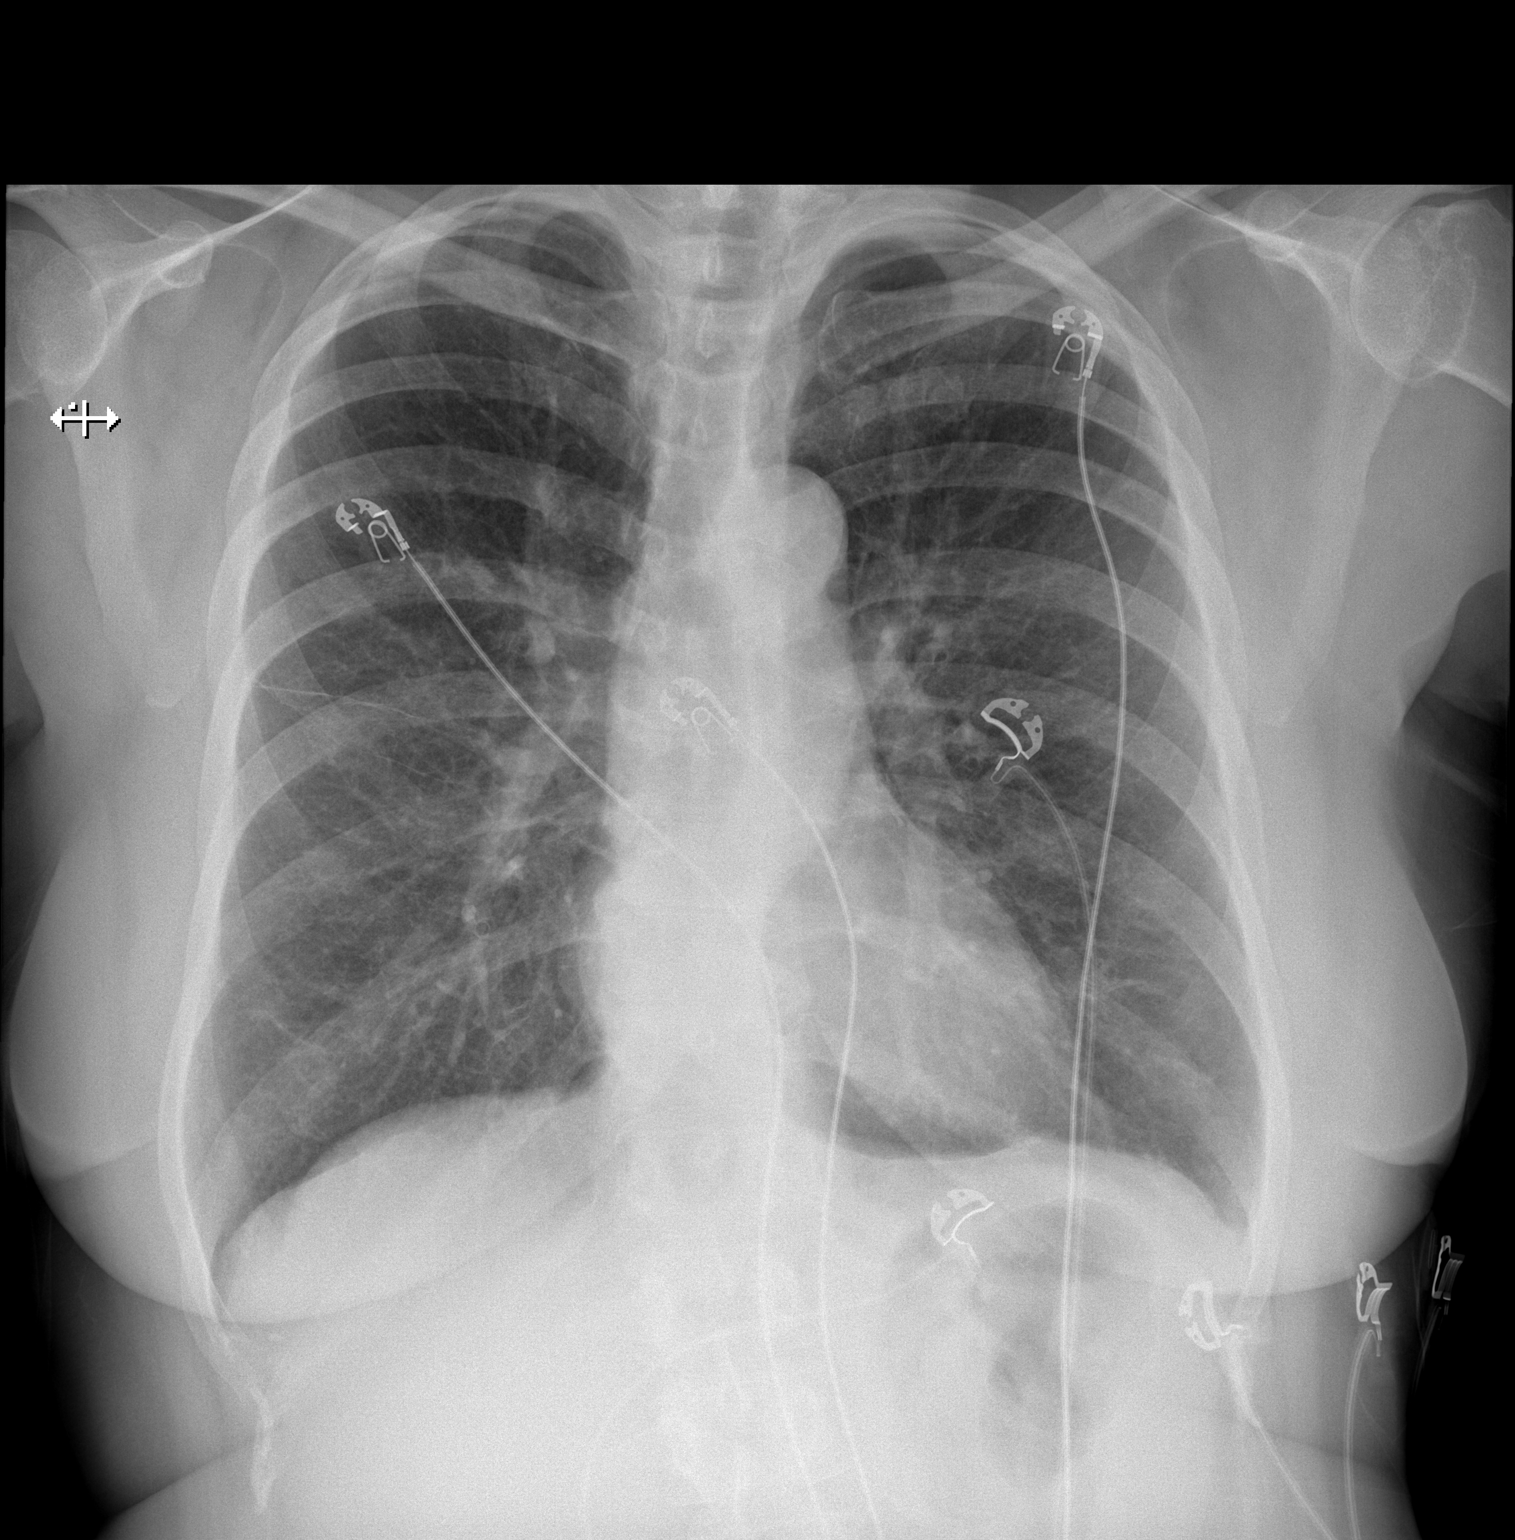

[w chest lat]
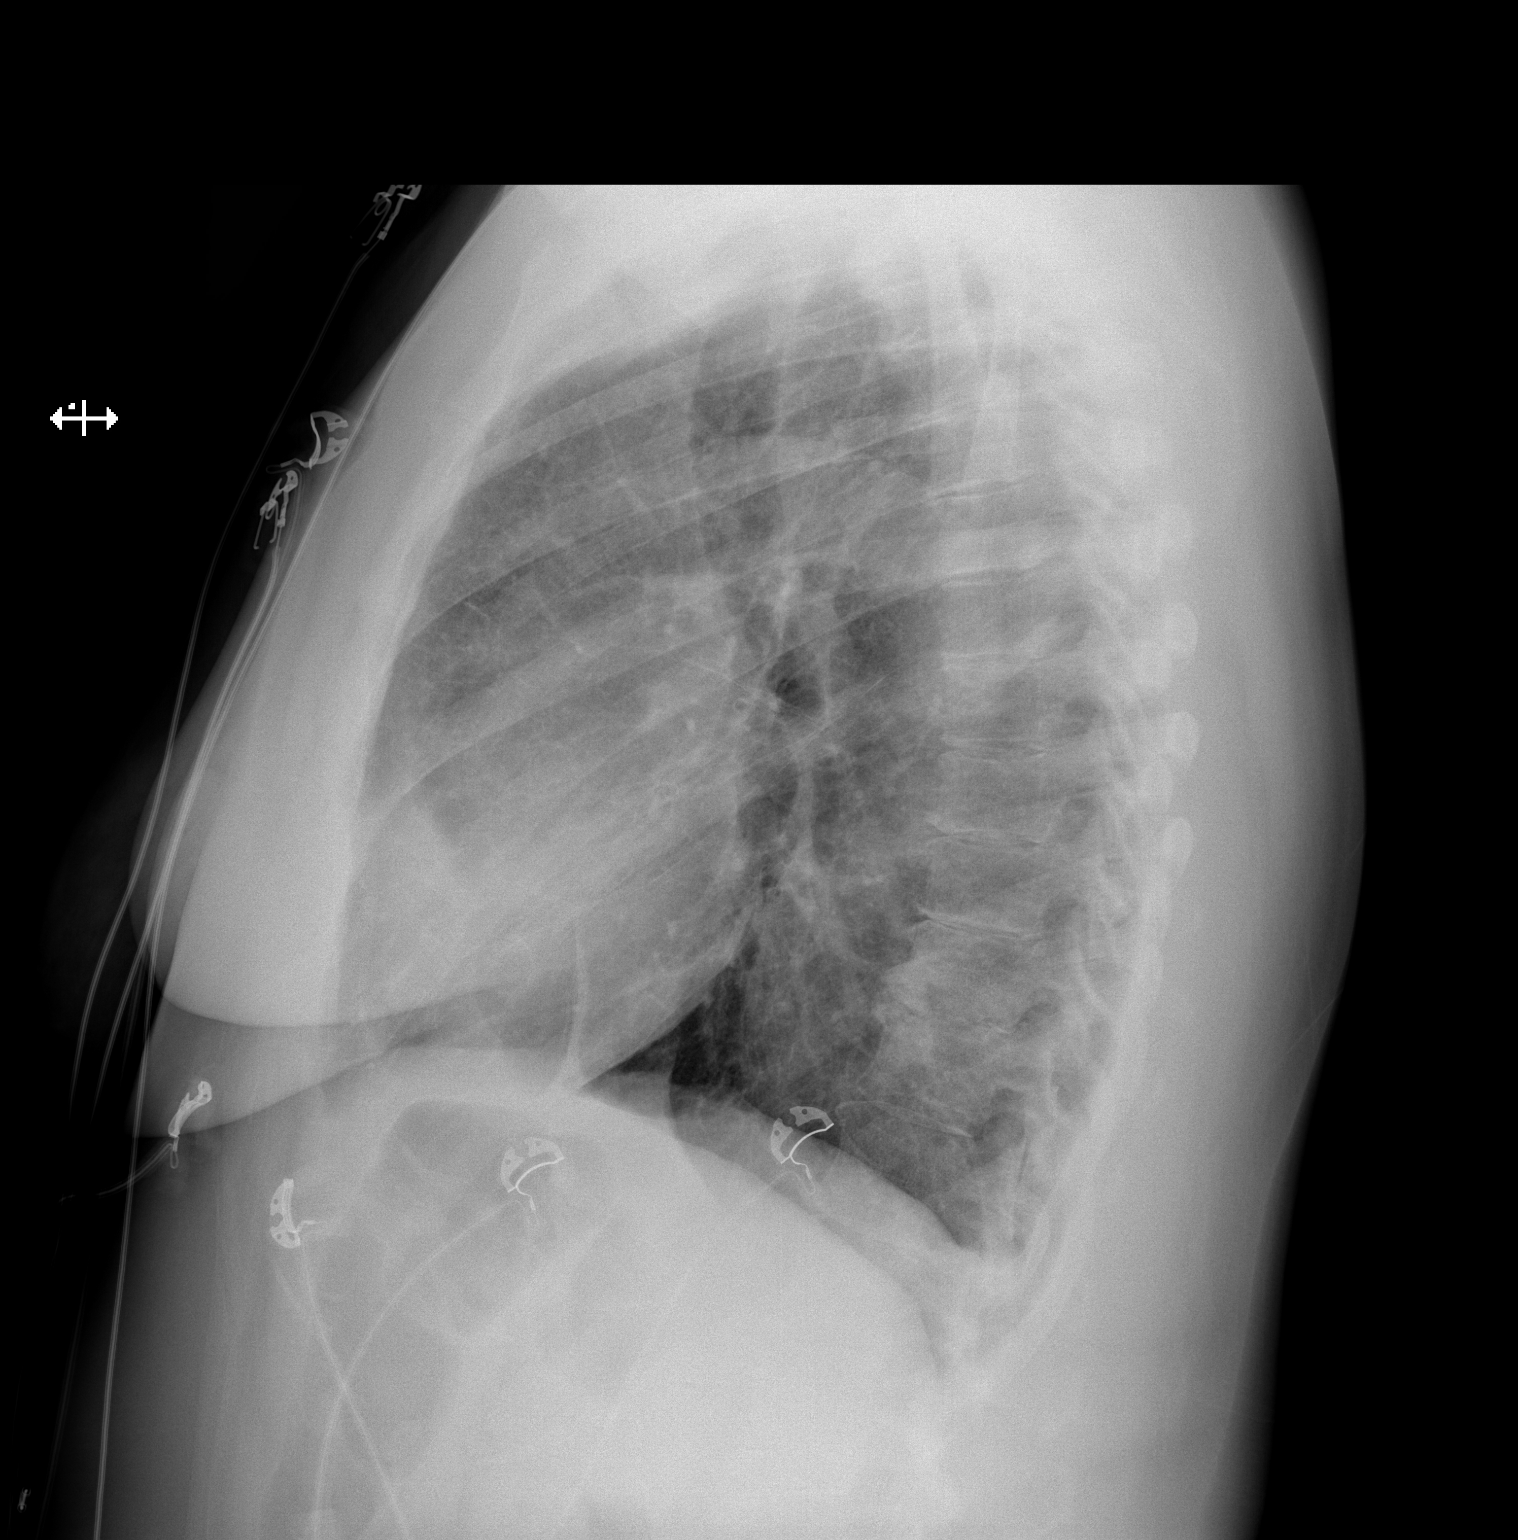

[2 of 2 positions shown; findings below may reference images not displayed]

FINDINGS: The cardiomediastinal silhouette is unremarkable.

There is no evidence of focal airspace disease, pulmonary edema,
suspicious pulmonary nodule/mass, pleural effusion, or pneumothorax.
No acute bony abnormalities are identified.
IMPRESSION: No active cardiopulmonary disease.

## 2017-01-12 ENCOUNTER — Encounter (HOSPITAL_COMMUNITY): Payer: Self-pay | Admitting: Emergency Medicine

## 2017-01-12 ENCOUNTER — Emergency Department (HOSPITAL_COMMUNITY)
Admission: EM | Admit: 2017-01-12 | Discharge: 2017-01-12 | Disposition: A | Discharge status: Home / Self Care | Payer: BLUE CROSS/BLUE SHIELD | Attending: Emergency Medicine | Admitting: Emergency Medicine

## 2017-01-12 DIAGNOSIS — M5410 Radiculopathy, site unspecified: Secondary | ICD-10-CM

## 2017-01-12 DIAGNOSIS — Z79899 Other long term (current) drug therapy: Secondary | ICD-10-CM | POA: Insufficient documentation

## 2017-01-12 DIAGNOSIS — R0981 Nasal congestion: Secondary | ICD-10-CM | POA: Diagnosis not present

## 2017-01-12 DIAGNOSIS — M549 Dorsalgia, unspecified: Secondary | ICD-10-CM | POA: Diagnosis present

## 2017-01-12 DIAGNOSIS — F17210 Nicotine dependence, cigarettes, uncomplicated: Secondary | ICD-10-CM | POA: Insufficient documentation

## 2017-01-12 DIAGNOSIS — I10 Essential (primary) hypertension: Secondary | ICD-10-CM | POA: Diagnosis not present

## 2017-01-12 MED ORDER — CYCLOBENZAPRINE HCL 10 MG PO TABS: 10.0000 mg | ORAL_TABLET | Freq: Two times a day (BID) | ORAL | 0 refills | Status: DC | PRN

## 2017-01-12 MED ORDER — PREDNISONE 20 MG PO TABS: ORAL_TABLET | ORAL | 0 refills | Status: DC
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
## 2017-01-12 NOTE — Discharge Instructions (Signed)
Take meloxicam previously prescribed along with muscle relaxant and steroid for your back pain.  You may take Flonase over the counter for your sinus congestion.

## 2017-01-12 NOTE — ED Notes (Signed)
Patient called for room from lobby with no response.

## 2017-01-12 NOTE — ED Provider Notes (Signed)
[facility] DEPT Provider Note   CSN: [id number] Arrival date & time: 01/12/17  1818     History   Chief Complaint Chief Complaint  Patient presents with  . Back Pain  . Leg Pain  . Facial Pain    HPI Melanie Guzman is a 61 y.o. female.  HPI   61 year old female presenting with multiple complaints. Patient report for the past 2-3 weeks she has had recurrent pain primarily to her left low back radiates down to her left leg with associated numbness. Pain worsening with ambulation, moderate in severity mildly improved with taking anti-inflammatory medication. She endorse urge incontinence without burning urination, bowel bladder incontinence or saddle anesthesia. She also has history of chronic bronchitis, complaining of having increased sinus congestion and sneezing and runny nose for the same duration. She denies fever, severe headache, chest pain or shortness of breath. She does not have a primary care provider.  Past Medical History:  Diagnosis Date  . Back pain   . Chronic bronchitis (HCC)   . Hypertension   . Pancreatitis     Patient Active Problem List   Diagnosis Date Noted  . Healthcare maintenance 11/30/2012  . Back pain 11/30/2012  . Depression 11/30/2012  . Abdominal pain, epigastric 11/30/2012  . RUQ abdominal pain 11/30/2012  . Tobacco abuse 11/30/2012    Past Surgical History:  Procedure Laterality Date  . ABDOMINAL HYSTERECTOMY     partial    OB History    Gravida Para Term Preterm AB Living   2 2 2     2    SAB TAB Ectopic Multiple Live Births                   Home Medications    Prior to Admission medications   Medication Sig Start Date End Date Taking? Authorizing Provider  ibuprofen (ADVIL,MOTRIN) 600 MG tablet Take 1 tablet (600 mg total) by mouth every 6 (six) hours as needed. 05/18/16   Fayrene Helperran, Sony Schlarb, PA-C  methocarbamol (ROBAXIN) 500 MG tablet Take 1 tablet (500 mg total) by mouth 2 (two) times daily. 05/18/16   Fayrene Helperran, Malakhai Beitler, PA-C    promethazine (PHENERGAN) 25 MG tablet Take 1 tablet (25 mg total) by mouth every 8 (eight) hours as needed for nausea or vomiting. 04/20/16   Benjiman CorePickering, Nathan, MD    Family History Family History  Problem Relation Age of Onset  . Heart attack Mother   . Stroke Maternal Grandmother   . Cancer Maternal Grandfather        bone  . Pancreatic cancer Paternal Grandmother   . Colon cancer Neg Hx     Social History Social History  Substance Use Topics  . Smoking status: Current Every Day Smoker    Packs/day: 1.00    Years: 40.00    Types: Cigarettes  . Smokeless tobacco: Never Used  . Alcohol use No     Allergies   Patient has no known allergies.   Review of Systems Review of Systems  All other systems reviewed and are negative.    Physical Exam Updated Vital Signs BP 134/76 (BP Location: Left Arm)   Pulse 79   Temp 98.4 F (36.9 C) (Oral)   Resp 19   Ht 5\' 8"  (1.727 m)   SpO2 97%   Physical Exam  Constitutional: She appears well-developed and well-nourished. No distress.  HENT:  Head: Atraumatic.  No sinus tenderness on percussion. Normal nares, throat exam is unremarkable.  Eyes: Conjunctivae are normal.  Neck:  Neck supple.  No nuchal rigidity  Cardiovascular: Intact distal pulses.   Abdominal: Soft. There is no tenderness.  Musculoskeletal: She exhibits tenderness (Tenderness to lumbar and left paralumbar spinal muscle on palpation, negative straight leg raise.).  5 out 5 strength to bilateral lower extremity with intact distal pedal pulses  Neurological: She is alert.  Skin: No rash noted.  Psychiatric: She has a normal mood and affect.  Nursing note and vitals reviewed.    ED Treatments / Results  Labs (all labs ordered are listed, but only abnormal results are displayed) Labs Reviewed - No data to display  EKG  EKG Interpretation None       Radiology No results found.  Procedures Procedures (including critical care time)  Medications  Ordered in ED Medications - No data to display   Initial Impression / Assessment and Plan / ED Course  I have reviewed the triage vital signs and the nursing notes.  Pertinent labs & imaging results that were available during my care of the patient were reviewed by me and considered in my medical decision making (see chart for details).     BP 134/76 (BP Location: Left Arm)   Pulse 79   Temp 98.4 F (36.9 C) (Oral)   Resp 19   Ht 5\' 8"  (1.727 m)   SpO2 97%    Final Clinical Impressions(s) / ED Diagnoses   Final diagnoses:  Sinus congestion  Radicular pain of left lower extremity    New Prescriptions New Prescriptions   CYCLOBENZAPRINE (FLEXERIL) 10 MG TABLET    Take 1 tablet (10 mg total) by mouth 2 (two) times daily as needed for muscle spasms.   PREDNISONE (DELTASONE) 20 MG TABLET    3 tabs po day one, then 2 tabs daily x 4 days   9:44 PM Patient complaining of radicular back pain affecting the left side. She also complaining of sinus congestion. She has no red flags. She is able to ambulate. She is neurovascularly intact. Patient will be discharged home with a course of steroid, and muscle relaxant. Recommend using Flonase without sinus congestion and allergy symptoms.   Fayrene Helperran, Antavius Sperbeck, PA-C 01/12/17 2146    Mancel BaleWentz, Elliott, MD 01/13/17 0001

## 2017-02-19 ENCOUNTER — Emergency Department (HOSPITAL_COMMUNITY): Payer: BLUE CROSS/BLUE SHIELD

## 2017-02-19 ENCOUNTER — Emergency Department (HOSPITAL_COMMUNITY)
Admission: EM | Admit: 2017-02-19 | Discharge: 2017-02-19 | Disposition: A | Discharge status: Home / Self Care | Payer: BLUE CROSS/BLUE SHIELD | Attending: Emergency Medicine | Admitting: Emergency Medicine

## 2017-02-19 ENCOUNTER — Encounter (HOSPITAL_COMMUNITY): Payer: Self-pay | Admitting: Emergency Medicine

## 2017-02-19 DIAGNOSIS — R6883 Chills (without fever): Secondary | ICD-10-CM | POA: Insufficient documentation

## 2017-02-19 DIAGNOSIS — F17210 Nicotine dependence, cigarettes, uncomplicated: Secondary | ICD-10-CM | POA: Insufficient documentation

## 2017-02-19 DIAGNOSIS — R0602 Shortness of breath: Secondary | ICD-10-CM | POA: Insufficient documentation

## 2017-02-19 DIAGNOSIS — Z79899 Other long term (current) drug therapy: Secondary | ICD-10-CM | POA: Diagnosis not present

## 2017-02-19 DIAGNOSIS — R110 Nausea: Secondary | ICD-10-CM | POA: Insufficient documentation

## 2017-02-19 DIAGNOSIS — I10 Essential (primary) hypertension: Secondary | ICD-10-CM | POA: Insufficient documentation

## 2017-02-19 DIAGNOSIS — R05 Cough: Secondary | ICD-10-CM | POA: Insufficient documentation

## 2017-02-19 DIAGNOSIS — R109 Unspecified abdominal pain: Secondary | ICD-10-CM

## 2017-02-19 DIAGNOSIS — R1013 Epigastric pain: Secondary | ICD-10-CM | POA: Diagnosis not present

## 2017-02-19 LAB — CBC
HEMATOCRIT: 42.8 % (ref 36.0–46.0)
HEMOGLOBIN: 14.8 g/dL (ref 12.0–15.0)
MCH: 29.9 pg (ref 26.0–34.0)
MCHC: 34.6 g/dL (ref 30.0–36.0)
MCV: 86.5 fL (ref 78.0–100.0)
Platelets: 190 10*3/uL (ref 150–400)
RBC: 4.95 MIL/uL (ref 3.87–5.11)
RDW: 13.3 % (ref 11.5–15.5)
WBC: 8.5 10*3/uL (ref 4.0–10.5)

## 2017-02-19 LAB — URINALYSIS, ROUTINE W REFLEX MICROSCOPIC
BILIRUBIN URINE: NEGATIVE
Glucose, UA: NEGATIVE mg/dL
HGB URINE DIPSTICK: NEGATIVE
Ketones, ur: NEGATIVE mg/dL
LEUKOCYTES UA: NEGATIVE
NITRITE: NEGATIVE
Protein, ur: 30 mg/dL — AB
SPECIFIC GRAVITY, URINE: 1.018 (ref 1.005–1.030)
pH: 6 (ref 5.0–8.0)

## 2017-02-19 LAB — COMPREHENSIVE METABOLIC PANEL
ALT: 54 U/L (ref 14–54)
ANION GAP: 8 (ref 5–15)
AST: 51 U/L — ABNORMAL HIGH (ref 15–41)
Albumin: 4.1 g/dL (ref 3.5–5.0)
Alkaline Phosphatase: 65 U/L (ref 38–126)
BILIRUBIN TOTAL: 0.8 mg/dL (ref 0.3–1.2)
BUN: 6 mg/dL (ref 6–20)
CO2: 24 mmol/L (ref 22–32)
Calcium: 10.2 mg/dL (ref 8.9–10.3)
Chloride: 104 mmol/L (ref 101–111)
Creatinine, Ser: 0.71 mg/dL (ref 0.44–1.00)
GFR calc Af Amer: >60 mL/min (ref >60)
Glucose, Bld: 143 mg/dL — ABNORMAL HIGH (ref 65–99)
POTASSIUM: 3.6 mmol/L (ref 3.5–5.1)
Sodium: 136 mmol/L (ref 135–145)
TOTAL PROTEIN: 8.2 g/dL — AB (ref 6.5–8.1)

## 2017-02-19 LAB — LIPASE, BLOOD: Lipase: 98 U/L — ABNORMAL HIGH (ref 11–51)

## 2017-02-19 LAB — POCT I-STAT TROPONIN I: TROPONIN I, POC: 0 ng/mL (ref 0.00–0.08)

## 2017-02-19 MED ORDER — ONDANSETRON HCL 4 MG PO TABS: 4.0000 mg | ORAL_TABLET | Freq: Three times a day (TID) | ORAL | 0 refills | Status: AC | PRN

## 2017-02-19 MED ORDER — SUCRALFATE 1 G PO TABS: 1.0000 g | ORAL_TABLET | Freq: Three times a day (TID) | ORAL | 0 refills | Status: DC

## 2017-02-19 MED ORDER — OMEPRAZOLE 20 MG PO CPDR: 40.0000 mg | DELAYED_RELEASE_CAPSULE | Freq: Every day | ORAL | 0 refills | Status: DC

## 2017-02-19 MED ORDER — SODIUM CHLORIDE 0.9 % IV BOLUS (SEPSIS)
1000.0000 mL | Freq: Once | INTRAVENOUS | Status: AC
  Administered 2017-02-19: 1000 mL via INTRAVENOUS

## 2017-02-19 MED ORDER — IOPAMIDOL (ISOVUE-300) INJECTION 61%
INTRAVENOUS | Status: AC
  Filled 2017-02-19: qty 100

## 2017-02-19 MED ORDER — ONDANSETRON HCL 4 MG/2ML IJ SOLN
4.0000 mg | Freq: Once | INTRAMUSCULAR | Status: AC
  Administered 2017-02-19: 4 mg via INTRAVENOUS
  Filled 2017-02-19: qty 2

## 2017-02-19 MED ORDER — MORPHINE SULFATE (PF) 4 MG/ML IV SOLN
4.0000 mg | Freq: Once | INTRAVENOUS | Status: AC
  Administered 2017-02-19: 4 mg via INTRAVENOUS
  Filled 2017-02-19: qty 1

## 2017-02-19 MED ORDER — FAMOTIDINE 20 MG PO TABS
20.0000 mg | ORAL_TABLET | Freq: Once | ORAL | Status: AC
  Administered 2017-02-19: 20 mg via ORAL
  Filled 2017-02-19: qty 1

## 2017-02-19 MED ORDER — PANTOPRAZOLE SODIUM 40 MG PO TBEC
40.0000 mg | DELAYED_RELEASE_TABLET | Freq: Once | ORAL | Status: AC
  Administered 2017-02-19: 40 mg via ORAL
  Filled 2017-02-19: qty 1

## 2017-02-19 MED ORDER — ALUM & MAG HYDROXIDE-SIMETH 200-200-20 MG/5ML PO SUSP
15.0000 mL | Freq: Once | ORAL | Status: AC
  Administered 2017-02-19: 15 mL via ORAL
  Filled 2017-02-19: qty 30

## 2017-02-19 MED ORDER — SUCRALFATE 1 G PO TABS
1.0000 g | ORAL_TABLET | Freq: Once | ORAL | Status: AC
  Administered 2017-02-19: 1 g via ORAL
  Filled 2017-02-19: qty 1

## 2017-02-19 MED ORDER — IOPAMIDOL (ISOVUE-300) INJECTION 61%
100.0000 mL | Freq: Once | INTRAVENOUS | Status: AC | PRN
  Administered 2017-02-19: 100 mL via INTRAVENOUS

## 2017-02-19 NOTE — ED Provider Notes (Signed)
[facility]-EMERGENCY DEPT Provider Note   CSN: [id number] Arrival date & time: 02/19/17  0707     History   Chief Complaint Chief Complaint  Patient presents with  . Abdominal Pain    HPI Melanie Guzman is a 61 y.o. female.  HPI   Patient is a 61 year old female a history of abdominal hysterectomy as her only abdominal surgery, and no other significant past medical history, presenting for six days of constant stabbing and aching epigastric pain. Patient reports that this pain is nonradiating and is approximately a 9 out of 10 at its worst. Patient reports pain is worse with movement, particularly lying on her side, and it is worse with eating. Patient has maintained some fluid intake but very minimal food intake since pain began. The patient has not tried anything to relieve the pain.Patient reports feeling feverish and chilled over this interval, and nauseous with no vomiting. Patient denies diarrhea, and reports last bowel movement was yesterday with no melena or hematochezia. Patient reports she's had some mild shortness of breath with going upstairs and cough, but no chest pain, lower extremity edema greater than baseline, no syncope/presyncope, dizziness or lightheadedness. Patient reports that she was on a course of steroids for one month prescribed by her primary care provider approximately 2 months ago. Patient has no history of cholecystitis or biliary colic, or peptic ulcer disease. Patient does report history of some gastric reflux that has been treated symptomatically. Patient has no personal cardiac history, however reports that her mother passed away from an MI at age 59.   Past Medical History:  Diagnosis Date  . Back pain   . Chronic bronchitis (HCC)   . Hypertension   . Pancreatitis     Patient Active Problem List   Diagnosis Date Noted  . Healthcare maintenance 11/30/2012  . Back pain 11/30/2012  . Depression 11/30/2012  . Abdominal pain, epigastric 11/30/2012  . RUQ  abdominal pain 11/30/2012  . Tobacco abuse 11/30/2012    Past Surgical History:  Procedure Laterality Date  . ABDOMINAL HYSTERECTOMY     partial    OB History    Gravida Para Term Preterm AB Living   SAB TAB Ectopic Multiple Live Births                   Home Medications    Prior to Admission medications   Medication Sig Start Date End Date Taking? Authorizing Provider  cyclobenzaprine (FLEXERIL) 10 MG tablet Take 1 tablet (10 mg total) by mouth 2 (two) times daily as needed for muscle spasms. Patient not taking: Reported on 02/19/2017 01/12/17   Fayrene Helper, PA-C  omeprazole (PRILOSEC) 20 MG capsule Take 2 capsules (40 mg total) by mouth daily. 02/19/17   Aviva Kluver B, PA-C  ondansetron (ZOFRAN) 4 MG tablet Take 1 tablet (4 mg total) by mouth every 8 (eight) hours as needed for nausea or vomiting. 02/19/17 02/24/17  Aviva Kluver B, PA-C  predniSONE (DELTASONE) 20 MG tablet 3 tabs po day one, then 2 tabs daily x 4 days Patient not taking: Reported on 02/19/2017 01/12/17   Fayrene Helper, PA-C  sucralfate (CARAFATE) 1 g tablet Take 1 tablet (1 g total) by mouth 4 (four) times daily -  with meals and at bedtime. 02/19/17   Elisha Ponder, PA-C    Family History Family History  Problem Relation Age of Onset  . Heart attack Mother   . Stroke Maternal  Grandmother   . Cancer Maternal Grandfather        bone  . Pancreatic cancer Paternal Grandmother   . Colon cancer Neg Hx     Social History Social History  Substance Use Topics  . Smoking status: Current Every Day Smoker    Packs/day: 1.00    Years: 40.00    Types: Cigarettes  . Smokeless tobacco: Never Used  . Alcohol use No     Allergies   Patient has no known allergies.   Review of Systems Review of Systems  Constitutional: Negative for chills and fever.  HENT: Positive for congestion, rhinorrhea and sinus pressure.   Eyes: Negative for visual disturbance.  Respiratory: Positive for shortness of  breath. Negative for cough and chest tightness.   Cardiovascular: Negative for chest pain, palpitations and leg swelling.  Gastrointestinal: Positive for abdominal pain and nausea. Negative for blood in stool, constipation, diarrhea and vomiting.       No melena  Genitourinary: Negative for dysuria and flank pain.  Musculoskeletal: Negative for back pain and myalgias.  Skin: Negative for rash.  Neurological: Negative for dizziness, syncope, light-headedness and headaches.     Physical Exam Updated Vital Signs BP (!) 142/96 (BP Location: Right Arm)   Pulse 68   Temp 98.5 F (36.9 C) (Oral)   Resp 18   SpO2 100%   Physical Exam  Constitutional: She appears well-developed and well-nourished. No distress.  HENT:  Head: Normocephalic and atraumatic.  Mouth/Throat: Oropharynx is clear and moist.  Eyes: Pupils are equal, round, and reactive to light. Conjunctivae and EOM are normal.  Neck: Normal range of motion. Neck supple.  Cardiovascular: Normal rate, regular rhythm, S1 normal, S2 normal and intact distal pulses.   No murmur heard. Pulmonary/Chest: Effort normal and breath sounds normal. She has no wheezes. She has no rales.  Rhonchi present.  Abdominal: Soft. She exhibits no distension. There is tenderness. There is no guarding.  Epigastric tenderness to light palpation. + Murphy's sign.  Musculoskeletal: Normal range of motion. She exhibits no edema or deformity.  Lymphadenopathy:    She has no cervical adenopathy.  Neurological: She is alert.  Cranial nerves grossly intact. Patient moves extremities with good coordination and without difficulty.  Skin: Skin is warm and dry. No rash noted. No erythema.  Psychiatric: She has a normal mood and affect. Her behavior is normal. Judgment and thought content normal.  Nursing note and vitals reviewed.    ED Treatments / Results  Labs (all labs ordered are listed, but only abnormal results are displayed) Labs Reviewed  LIPASE,  BLOOD - Abnormal; Notable for the following:       Result Value   Lipase 98 (*)    All other components within normal limits  COMPREHENSIVE METABOLIC PANEL - Abnormal; Notable for the following:    Glucose, Bld 143 (*)    Total Protein 8.2 (*)    AST 51 (*)    All other components within normal limits  URINALYSIS, ROUTINE W REFLEX MICROSCOPIC - Abnormal; Notable for the following:    Color, Urine AMBER (*)    Protein, ur 30 (*)    Bacteria, UA RARE (*)    Squamous Epithelial / LPF 0-5 (*)    All other components within normal limits  CBC  I-STAT TROPONIN, ED  POCT I-STAT TROPONIN I    EKG  EKG Interpretation  Date/Time:  Thursday February 19 2017 07:38:45 EDT Ventricular Rate:  80 PR Interval:  QRS Duration: 84 QT Interval:  388 QTC Calculation: 448 R Axis:   74 Text Interpretation:  Sinus rhythm No significant change since last tracing Confirmed by Raeford RazorKohut, Stephen [phone]) on 02/19/2017 8:22:12 AM       Radiology Dg Chest 2 View  Result Date: 02/19/2017 CLINICAL DATA:  Mid upper abdominal pain and pressure sensation for the past week. New onset of nausea and chills yesterday. History of pancreatitis, chronic bronchitis, current smoker. EXAM: CHEST  2 VIEW COMPARISON:  PA and lateral chest x-ray of April 20, 2016 FINDINGS: The lungs are mildly hyperinflated. The interstitial markings are coarse though stable. The heart and pulmonary vascularity are normal. The trachea is midline. The mediastinum is normal in width. There is calcification in the wall of the aortic arch. There is multilevel degenerative disc disease of the thoracic spine. IMPRESSION: Chronic bronchitic changes, stable. No pneumonia, CHF, nor other acute cardiopulmonary abnormality. Thoracic aortic atherosclerosis. Electronically Signed   By: David  SwazilandJordan M.D.   On: 02/19/2017 07:57   Ct Abdomen Pelvis W Contrast  Result Date: 02/19/2017 CLINICAL DATA:  Acute upper abdominal pain. EXAM: CT ABDOMEN AND PELVIS  WITH CONTRAST TECHNIQUE: Multidetector CT imaging of the abdomen and pelvis was performed using the standard protocol following bolus administration of intravenous contrast. CONTRAST:  100mL ISOVUE-300 IOPAMIDOL (ISOVUE-300) INJECTION 61% COMPARISON:  CT scan of November 27, 2008. FINDINGS: Lower chest: No acute abnormality. Hepatobiliary: No gallstones are noted. Stable right hepatic low density is noted most consistent with cyst. No other abnormality seen in the liver. Pancreas: Unremarkable. No pancreatic ductal dilatation or surrounding inflammatory changes. Spleen: Normal in size without focal abnormality. Adrenals/Urinary Tract: Adrenal glands are unremarkable. Kidneys are normal, without renal calculi, focal lesion, or hydronephrosis. Bladder is unremarkable. Stomach/Bowel: Stomach is within normal limits. Appendix appears normal. No evidence of bowel wall thickening, distention, or inflammatory changes. Vascular/Lymphatic: Aortic atherosclerosis. No enlarged abdominal or pelvic lymph nodes. Reproductive: Status post hysterectomy. No adnexal masses. Other: No abdominal wall hernia or abnormality. No abdominopelvic ascites. Musculoskeletal: No acute or significant osseous findings. IMPRESSION: Aortic atherosclerosis. Stable right hepatic low density is noted most consistent with cyst. No acute abnormality seen in the abdomen or pelvis. Electronically Signed   By: Lupita RaiderJames  Green Jr, M.D.   On: 02/19/2017 10:51   [place] Abdomen Limited Ruq  Result Date: 02/19/2017 CLINICAL DATA:  Abdominal pain and nausea EXAM: ULTRASOUND ABDOMEN LIMITED RIGHT UPPER QUADRANT COMPARISON:  December 03, 2012 FINDINGS: Gallbladder: There are foci of sludge in the gallbladder. There are no echogenic foci in the gallbladder which move and shadow as is expected with gallstones. There is no gallbladder wall thickening or pericholecystic fluid. No sonographic Murphy sign noted by sonographer. Common bile duct: Diameter: 3 mm. No intrahepatic or  extrahepatic biliary duct dilatation. Liver: No focal lesion identified. Within normal limits in parenchymal echogenicity. Portal vein is patent on color Doppler imaging with normal direction of blood flow towards the liver. IMPRESSION: Sludge in gallbladder. No gallstones evident. No gallbladder wall thickening or pericholecystic fluid. Study otherwise unremarkable. Electronically Signed   By: Bretta BangWilliam  Woodruff III M.D.   On: 02/19/2017 10:13    Procedures Procedures (including critical care time)  Medications Ordered in ED Medications  sodium chloride 0.9 % bolus 1,000 mL (0 mLs Intravenous Stopped 02/19/17 1209)  morphine 4 MG/ML injection 4 mg (4 mg Intravenous Given 02/19/17 0912)  ondansetron (ZOFRAN) injection 4 mg (4 mg Intravenous Given 02/19/17 0912)  iopamidol (ISOVUE-300) 61 % injection 100  mL (100 mLs Intravenous Contrast Given 02/19/17 1023)  famotidine (PEPCID) tablet 20 mg (20 mg Oral Given 02/19/17 1150)  alum & mag hydroxide-simeth (MAALOX/MYLANTA) 200-200-20 MG/5ML suspension 15 mL (15 mLs Oral Given 02/19/17 1150)  pantoprazole (PROTONIX) EC tablet 40 mg (40 mg Oral Given 02/19/17 1220)  sucralfate (CARAFATE) tablet 1 g (1 g Oral Given 02/19/17 1220)     Initial Impression / Assessment and Plan / ED Course  I have reviewed the triage vital signs and the nursing notes.  Pertinent labs & imaging results that were available during my care of the patient were reviewed by me and considered in my medical decision making (see chart for details).  Clinical Course as of Feb 19 1825  Thu Feb 19, 2017  1610 Patient seen and evaluated. Discussed differential diagnosis with patient and planned studies. Patient is in agreement with plan of care.  [AM]  1220 Patient reevaluated. Pain and nausea improved. Repeat abdominal exam demonstrates continued epigastric tenderness. Discussed test results with patient, and that most likely source of pain is gastritis versus PUD. Return precautions discussed for  worsening GI or cardiac symptoms.  [AM]  1822 Lipase: (!) 98 [AM]    Clinical Course User Index [AM] Elisha Ponder, PA-C     Final Clinical Impressions(s) / ED Diagnoses   Final diagnoses:  Abdominal pain  Epigastric pain   MDM  Patient is a 61 year old female a history of abdominal hysterectomy as her only abdominal surgery, and no other significant past medical history, presenting for six days of constant stabbing and aching epigastric pain. Differential diagnoses evaluated include pancreatitis, PUD, gastritis, cholecystitis, pyelonephritis, biliary colic, pyleonephritis, ACS, AAA. Patient's lipase was elevated at 98 but doubt this is source of patient's pain, overdoses twice a upper limit of normal and no stranding evident on CT. Doubt cholecystitis or biliary colic as the source of patient's pain as right upper quadrant ultrasound did not demonstrate gallbladder wall thickening, and sludge is present but no gallstones. Pyelonephritis as patient's urine showed no evidence of infection. Doubt ACS as patient's initial troponin was 0 and EKG is in normal sinus rhythm with no evidence of ischemia, infarction, or arrhythmia.   Discussed diagnostic uncertainty with the patient. Given that peptic ulcers and gastritis cannot be seen on a CT scan, and patient's history is most consistent with these pathologies, I suspect gastritis and/or PUD as the source of patient's pain. Patient's pain and nausea were treated in the emergency department with morphine and Zofran. Patient discharged with prescriptions for omeprazole, Carafate, and Zofran and given instructions to follow up with [facility] gastroenterology. Patient given strict return precautions for any acute gastrointestinal or cardiopulmonary concerns. Patient in understanding and agrees with the plan of care.  This is a supervised visit with Dr. Cathren Laine. Evaluation, management, and discharge planning discussed with this attending  physician.  New Prescriptions Discharge Medication List as of 02/19/2017 12:18 PM    START taking these medications   Details  ondansetron (ZOFRAN) 4 MG tablet Take 1 tablet (4 mg total) by mouth every 8 (eight) hours as needed for nausea or vomiting., Starting Thu 02/19/2017, Until Tue 02/24/2017, Print    sucralfate (CARAFATE) 1 g tablet Take 1 tablet (1 g total) by mouth 4 (four) times daily -  with meals and at bedtime., Starting Thu 02/19/2017, Print         [place], Arlyss Repress B, PA-C 02/19/17 9604    Cathren Laine, MD 02/23/17 1122

## 2017-02-19 NOTE — ED Triage Notes (Signed)
Pt reports midline upper abd pain for the past 4 days. Accompanied by nausea, and hot/cold chills. No v/d.

## 2017-02-19 NOTE — Discharge Instructions (Signed)
Please see the information and instructions below regarding your visit.  Your diagnoses today include:  1. Epigastric pain   2. Abdominal pain    Your testing today is reassuring that her abdominal pain is not coming from pancreas or gallbladder. We believe it is most likely that your pain is due to a possible ulcer in your stomach which we cannot see on imaging.  Tests performed today include:  Please see attached documented failure CT report.  See side panel of your discharge paperwork for testing performed today. Vital signs are listed at the bottom of these instructions.   Medications prescribed:    Your prescribed omeprazole, a medication that helps reduce acid in your stomach. Please take this once a day in the morning. You are also prescribed sucralfate, a medication to help provide relief to areas that may have ulcers in her stomach. The medications Zofran is for nausea.  Take any prescribed medications only as prescribed, and any over the counter medications only as directed on the packaging.  Home care instructions:  Please follow any educational materials contained in this packet.   Follow-up instructions:  Please follow up with  Gastroenterology in 2 weeks for continued workup of your abdominal pain. Please call to make an appointment within the next 2 weeks for follow-up.  Return instructions:  Please return to the Emergency Department if you experience worsening symptoms.  Please come back to see us for any worsening pain, inability to eat or drink because of nausea, blood or dark black color to your bowel movements, or chest pain. Please return if you have any other emergent concerns.   Your vital signs today were: BP (!) 142/96 (BP Location: Right Arm)    Pulse 68    Temp 98.5 F (36.9 C) (Oral)    Resp 18    SpO2 100%  If your blood pressure (BP) was elevated on multiple readings during this visit above 130 for the top number or above 80 for the bottom  number, please have this repeated by your primary care provider within one month. --------------  Thank you for allowing us to participate in your care today.

## 2017-03-03 ENCOUNTER — Encounter: Payer: Self-pay | Admitting: Gastroenterology

## 2017-03-06 ENCOUNTER — Ambulatory Visit (INDEPENDENT_AMBULATORY_CARE_PROVIDER_SITE_OTHER): Payer: BLUE CROSS/BLUE SHIELD | Admitting: Physician Assistant

## 2017-03-06 ENCOUNTER — Other Ambulatory Visit (INDEPENDENT_AMBULATORY_CARE_PROVIDER_SITE_OTHER): Payer: BLUE CROSS/BLUE SHIELD

## 2017-03-06 ENCOUNTER — Encounter: Payer: Self-pay | Admitting: Physician Assistant

## 2017-03-06 VITALS — BP 142/98 | HR 76 | Ht 67.25 in | Wt 214.5 lb

## 2017-03-06 DIAGNOSIS — R1013 Epigastric pain: Secondary | ICD-10-CM

## 2017-03-06 DIAGNOSIS — R7989 Other specified abnormal findings of blood chemistry: Secondary | ICD-10-CM | POA: Diagnosis not present

## 2017-03-06 DIAGNOSIS — R945 Abnormal results of liver function studies: Secondary | ICD-10-CM

## 2017-03-06 LAB — H. PYLORI ANTIBODY, IGG: H Pylori IgG: POSITIVE — AB

## 2017-03-06 LAB — HEPATIC FUNCTION PANEL
ALBUMIN: 4.2 g/dL (ref 3.5–5.2)
ALT: 34 U/L (ref 0–35)
AST: 31 U/L (ref 0–37)
Alkaline Phosphatase: 61 U/L (ref 39–117)
Bilirubin, Direct: 0.1 mg/dL (ref 0.0–0.3)
TOTAL PROTEIN: 8 g/dL (ref 6.0–8.3)
Total Bilirubin: 0.4 mg/dL (ref 0.2–1.2)

## 2017-03-06 LAB — LIPASE: Lipase: 16 U/L (ref 11.0–59.0)

## 2017-03-06 MED ORDER — SUCRALFATE 1 G PO TABS: ORAL_TABLET | ORAL | 1 refills | Status: DC

## 2017-03-06 MED ORDER — OMEPRAZOLE 20 MG PO CPDR: DELAYED_RELEASE_CAPSULE | ORAL | 3 refills | Status: DC

## 2017-03-06 NOTE — Progress Notes (Signed)
Reviewed and agree with management plan.  Mackson Botz T. Chanay Nugent, MD FACG 

## 2017-03-06 NOTE — Patient Instructions (Addendum)
Please go to the basement level to have your labs drawn.  We have sent the following medications to your pharmacy for you to pick up at your convenience: [facility]  1. Omeprazole 20 mg 2. Carafate ( Sulcrafate) tablets.  Stop smoking. Call us in 1-2 weeks and make an appointment to see Mike Gip PA middle of October.

## 2017-03-06 NOTE — Progress Notes (Signed)
Subjective:    Patient ID: Melanie Guzman, female    DOB: 1956/03/16, 61 y.o.   MRN: [id number]  HPI Kamaiya is a pleasant 61 year old African-American female, eferred by the [facility] emergency room/Dr. Denton Lank after her ER visit on 02/19/2017. She is known to Dr. Arlyce Dice previously with colonoscopy done in 2015 which was a normal exam. She had presented with epigastric pain which she says was present for a couple of weeks prior to the ER visit but progressed. She describes this as a nagging and aching burning type pain. She did not have any associated nausea or vomiting. She did have some chills and nausea at onset, no vomiting. She feels her pain was worse postprandially. She does not have any chronic problems with heartburn or indigestion. Bowel movements have been normal without melena or hematochezia. She's not been on any regular aspirin or NSAIDs, no regular EtOH, she is a smoker. She says last year she had been on a lot of ibuprofen on a daily basis for back pain but had stopped that earlier this year. Labs at the time of ER visit CBC within normal limits, AST 51 ALT 54, alkaline phosphatase 65, lipase 98, UA negative. Upper abdominal ultrasound showed sludge in the gallbladder no stones or gallbladder wall thickening and CBD of 3 mm. CT of the abdomen and pelvis showed a small stable hepatic cyst and aortic atherosclerosis, otherwise negative. She was started on Carafate and Prilosec twice daily and says she is about 50% better. She does mention that she was told that she had pancreatitis once several years ago.   Review of Systems Pertinent positive and negative review of systems were noted in the above HPI section.  All other review of systems was otherwise negative.  Outpatient Encounter Prescriptions as of 03/06/2017  Medication Sig  . omeprazole (PRILOSEC) 20 MG capsule Take 2 tablets every morning.  . ondansetron (ZOFRAN) 4 MG tablet Take 4 mg by mouth every 8 (eight) hours as  needed for nausea or vomiting.  . sucralfate (CARAFATE) 1 g tablet Take 1 tab between meals and at bedtime , 4 times daily for 14 days.  . [DISCONTINUED] omeprazole (PRILOSEC) 20 MG capsule Take 2 capsules (40 mg total) by mouth daily.  . [DISCONTINUED] sucralfate (CARAFATE) 1 g tablet Take 1 tablet (1 g total) by mouth 4 (four) times daily -  with meals and at bedtime.  . cyclobenzaprine (FLEXERIL) 10 MG tablet Take 1 tablet (10 mg total) by mouth 2 (two) times daily as needed for muscle spasms. (Patient not taking: Reported on 03/06/2017)  . [DISCONTINUED] predniSONE (DELTASONE) 20 MG tablet 3 tabs po day one, then 2 tabs daily x 4 days (Patient not taking: Reported on 03/06/2017)   No facility-administered encounter medications on file as of 03/06/2017.    No Known Allergies Patient Active Problem List   Diagnosis Date Noted  . Healthcare maintenance 11/30/2012  . Back pain 11/30/2012  . Depression 11/30/2012  . Abdominal pain, epigastric 11/30/2012  . RUQ abdominal pain 11/30/2012  . Tobacco abuse 11/30/2012   Social History   Social History  . Marital status: Legally Separated    Spouse name: N/A  . Number of children: 2  . Years of education: N/A   Occupational History  . Not on file.   Social History Main Topics  . Smoking status: Current Every Day Smoker    Packs/day: 1.00    Years: 40.00    Types: Cigarettes  . Smokeless tobacco: Never  Used  . Alcohol use No  . Drug use: No  . Sexual activity: Yes    Birth control/ protection: Surgical   Other Topics Concern  . Not on file   Social History Narrative  . No narrative on file    Ms. Liebler's family history includes Bone cancer in her maternal grandfather; Heart attack in her mother; Pancreatic cancer in her paternal grandmother; Stroke in her maternal grandmother.      Objective:    Vitals:   03/06/17 0900  BP: (!) 142/98  Pulse: 76    Physical Exam  well-developed older African-American female in no  acute distress, pleasant blood pressure 142/98 pulse 76, BMI 33.3. HEENT; nontraumatic normocephalic EOMI PERRLA sclera anicteric, Cardiovascular; regular rate and rhythm with S1-S2 no murmur or gallop, Pulmonary; clear bilaterally, Abdomen ;soft, minimally tender in the epigastrium there is no guarding or rebound no palpable mass or hepatosplenomegaly bowel sounds are present, Rectal ;exam not done, Extremities; no clubbing cyanosis or edema skin warm and dry, Neuropsych; mood and affect appropriate       Assessment & Plan:   #22 61 year old African-American female with 1 month history of epigastric abdominal pain, somewhat worse postprandially. She is improving on Carafate and Prilosec. Recent ultrasound and CT negative for any acute findings, she does have gallbladder sludge. I suspect her pain is secondary to gastritis or peptic ulcer disease #2 colon cancer surveillance-up-to-date with negative colonoscopy 2015-plan 10 year interval follow-up #3 minimal transaminase elevation-Patient may have been diagnosed with hepatitis C remotely in the late 80s and received some sort of treatment. #4 status post hysterectomy #5 depression  Plan; continue Prilosec 40 mg by mouth twice daily, refills sent Continue Carafate 1 g by mouth 4 times a day for 2 more weeks then stop  Avoid all aspirin and NSAIDs Repeat hepatic panel today, check hepatitis serologies, H. pylori antibody. We'll plan office follow-up in 3-4 weeks with me.  If symptoms are persisting at that time would pursue upper endoscopy.     Marlyce Mcdougald S Aleighna Wojtas PA-C 03/06/2017   Cc: No ref. provider found

## 2017-03-11 LAB — HEPATITIS B SURFACE ANTIGEN: Hepatitis B Surface Ag: NONREACTIVE

## 2017-03-11 LAB — HCV RNA,QUANTITATIVE REAL TIME PCR
HCV Quantitative Log: 5.95 Log IU/mL — ABNORMAL HIGH
HCV RNA, PCR, QN: 882000 IU/mL — ABNORMAL HIGH

## 2017-03-11 LAB — HEPATITIS C ANTIBODY
Hepatitis C Ab: REACTIVE — AB
SIGNAL TO CUT-OFF: 29 — ABNORMAL HIGH (ref <1.00)

## 2017-03-11 LAB — HEPATITIS B SURFACE ANTIBODY,QUALITATIVE: Hep B S Ab: BORDERLINE — AB

## 2017-03-13 ENCOUNTER — Telehealth: Payer: Self-pay | Admitting: Physician Assistant

## 2017-03-16 ENCOUNTER — Telehealth: Payer: Self-pay | Admitting: Physician Assistant

## 2017-03-16 NOTE — Telephone Encounter (Signed)
Faxed as requested

## 2017-03-16 NOTE — Telephone Encounter (Signed)
Faxed as patient requested.

## 2017-04-08 ENCOUNTER — Ambulatory Visit: Payer: BLUE CROSS/BLUE SHIELD | Admitting: Physician Assistant

## 2017-04-15 ENCOUNTER — Ambulatory Visit: Payer: BLUE CROSS/BLUE SHIELD | Admitting: Physician Assistant

## 2017-04-17 ENCOUNTER — Ambulatory Visit: Payer: BLUE CROSS/BLUE SHIELD | Admitting: Physician Assistant

## 2017-04-17 ENCOUNTER — Other Ambulatory Visit: Payer: Self-pay | Admitting: Nurse Practitioner

## 2017-04-17 DIAGNOSIS — B182 Chronic viral hepatitis C: Secondary | ICD-10-CM

## 2017-04-27 ENCOUNTER — Other Ambulatory Visit: Payer: BLUE CROSS/BLUE SHIELD

## 2017-04-30 ENCOUNTER — Telehealth: Payer: Self-pay | Admitting: Physician Assistant

## 2017-04-30 ENCOUNTER — Ambulatory Visit: Payer: BLUE CROSS/BLUE SHIELD | Admitting: Physician Assistant

## 2017-05-06 ENCOUNTER — Ambulatory Visit
Admission: RE | Admit: 2017-05-06 | Discharge: 2017-05-06 | Disposition: A | Discharge status: Home / Self Care | Payer: BLUE CROSS/BLUE SHIELD | Source: Ambulatory Visit | Attending: Nurse Practitioner | Admitting: Nurse Practitioner

## 2017-05-06 DIAGNOSIS — B182 Chronic viral hepatitis C: Secondary | ICD-10-CM

## 2017-05-13 ENCOUNTER — Encounter (HOSPITAL_COMMUNITY): Payer: Self-pay

## 2017-06-17 NOTE — Telephone Encounter (Signed)
Done

## 2017-10-14 ENCOUNTER — Other Ambulatory Visit: Payer: Self-pay | Admitting: Nurse Practitioner

## 2017-10-14 DIAGNOSIS — K7469 Other cirrhosis of liver: Secondary | ICD-10-CM

## 2017-10-20 ENCOUNTER — Ambulatory Visit (HOSPITAL_COMMUNITY): Payer: BLUE CROSS/BLUE SHIELD

## 2017-11-26 ENCOUNTER — Ambulatory Visit (HOSPITAL_COMMUNITY): Payer: BLUE CROSS/BLUE SHIELD

## 2017-11-27 ENCOUNTER — Ambulatory Visit: Payer: BLUE CROSS/BLUE SHIELD

## 2017-12-01 ENCOUNTER — Ambulatory Visit: Payer: BLUE CROSS/BLUE SHIELD

## 2017-12-01 ENCOUNTER — Ambulatory Visit (HOSPITAL_COMMUNITY)
Admission: RE | Admit: 2017-12-01 | Discharge: 2017-12-01 | Disposition: A | Discharge status: Home / Self Care | Payer: BLUE CROSS/BLUE SHIELD | Source: Ambulatory Visit | Attending: Nurse Practitioner | Admitting: Nurse Practitioner

## 2017-12-01 ENCOUNTER — Ambulatory Visit (INDEPENDENT_AMBULATORY_CARE_PROVIDER_SITE_OTHER): Payer: BLUE CROSS/BLUE SHIELD | Admitting: Internal Medicine

## 2017-12-01 ENCOUNTER — Other Ambulatory Visit: Payer: Self-pay

## 2017-12-01 ENCOUNTER — Encounter (INDEPENDENT_AMBULATORY_CARE_PROVIDER_SITE_OTHER): Payer: Self-pay

## 2017-12-01 VITALS — BP 134/76 | HR 74 | Temp 98.0°F | Ht 67.0 in | Wt 224.1 lb

## 2017-12-01 DIAGNOSIS — K7469 Other cirrhosis of liver: Secondary | ICD-10-CM

## 2017-12-01 DIAGNOSIS — F419 Anxiety disorder, unspecified: Secondary | ICD-10-CM

## 2017-12-01 DIAGNOSIS — R7309 Other abnormal glucose: Secondary | ICD-10-CM

## 2017-12-01 DIAGNOSIS — Z598 Other problems related to housing and economic circumstances: Secondary | ICD-10-CM

## 2017-12-01 DIAGNOSIS — E785 Hyperlipidemia, unspecified: Secondary | ICD-10-CM | POA: Diagnosis not present

## 2017-12-01 DIAGNOSIS — K5900 Constipation, unspecified: Secondary | ICD-10-CM

## 2017-12-01 DIAGNOSIS — Z0000 Encounter for general adult medical examination without abnormal findings: Secondary | ICD-10-CM

## 2017-12-01 DIAGNOSIS — F32A Depression, unspecified: Secondary | ICD-10-CM

## 2017-12-01 DIAGNOSIS — F17210 Nicotine dependence, cigarettes, uncomplicated: Secondary | ICD-10-CM | POA: Diagnosis not present

## 2017-12-01 DIAGNOSIS — B1920 Unspecified viral hepatitis C without hepatic coma: Secondary | ICD-10-CM | POA: Diagnosis not present

## 2017-12-01 DIAGNOSIS — F329 Major depressive disorder, single episode, unspecified: Secondary | ICD-10-CM

## 2017-12-01 DIAGNOSIS — Z653 Problems related to other legal circumstances: Secondary | ICD-10-CM

## 2017-12-01 DIAGNOSIS — Z90710 Acquired absence of both cervix and uterus: Secondary | ICD-10-CM | POA: Diagnosis not present

## 2017-12-01 DIAGNOSIS — Z13220 Encounter for screening for lipoid disorders: Secondary | ICD-10-CM

## 2017-12-01 DIAGNOSIS — J40 Bronchitis, not specified as acute or chronic: Secondary | ICD-10-CM | POA: Diagnosis not present

## 2017-12-01 DIAGNOSIS — M2550 Pain in unspecified joint: Secondary | ICD-10-CM

## 2017-12-01 DIAGNOSIS — Z720 Tobacco use: Secondary | ICD-10-CM

## 2017-12-01 DIAGNOSIS — Z79899 Other long term (current) drug therapy: Secondary | ICD-10-CM

## 2017-12-01 DIAGNOSIS — Z8742 Personal history of other diseases of the female genital tract: Secondary | ICD-10-CM

## 2017-12-01 LAB — POCT GLYCOSYLATED HEMOGLOBIN (HGB A1C): Hemoglobin A1C: 5.7 % — AB (ref 4.0–5.6)

## 2017-12-01 LAB — GLUCOSE, CAPILLARY: GLUCOSE-CAPILLARY: 108 mg/dL — AB (ref 65–99)

## 2017-12-01 NOTE — Assessment & Plan Note (Addendum)
At this time the patient is not ready to quit.  She still is dealing with a lot of depression, anxiety and extractors.  She states at this time it is not realistic to try and quit.  She self reports a history of chronic bronchitis.  No PFTs in the chart.  Currently smoking 1 to 2 packs/day.  Plan: -Continue to assess desire to quit

## 2017-12-01 NOTE — Patient Instructions (Addendum)
[name],   It was nice meeting you today.   I have referred you to psychology.  I will call you with your lab results.   I have given you a sample of an albuterol inhaler.   Take 2 puffs of the inhaler every 6 hours as needed for shortness of breath, chest tightness, or wheezing.   Return to clinic in month for follow up with your primary care doctor.

## 2017-12-01 NOTE — Assessment & Plan Note (Addendum)
Patient states she has recently completed Harvoni treatment course.  She states she got a repeat HCV RNA levels drawn this morning.  [facility]  She also had an right upper quadrant abdominal ultrasound today for HCC screening.  Abdominal ultrasound demonstrated diffuse heterogeneity and nodularity is noted consistent with the given clinical history of underlying cirrhosis; no focal mass is noted.  She is requesting hepatitis A and hepatitis B vaccinations which she had not completed yet.   Plan:  -New more efficacious hepatitis B vaccination and will be available to our clinic in months time, with a 92% response rate for the patient's age range -patient to follow-up in 1 to 2 months and start hepatitis B vaccination series HEPLISAV-B -We will check hepatitis a antibody levels to determine immunity and need for vaccination -Please address vaccination at next clinic visit  Addendum 12/03/2017:  Hepatitis A total antibody levels were negative.  The patient will need hepatitis A vaccination.  Please ensure the patient receives this vaccination at next visit.

## 2017-12-01 NOTE — Progress Notes (Signed)
Internal Medicine Clinic Attending  Case discussed with Dr. LaCroce at the time of the visit.  We reviewed the resident's history and exam and pertinent patient test results.  I agree with the assessment, diagnosis, and plan of care documented in the resident's note.  

## 2017-12-01 NOTE — Progress Notes (Signed)
   CC: establish care  HPI:  [name] is a 62 y.o. female with a past medical history listed below here today for establishment of care.   For details of today's visit and the status of her acute and chronic medical issues please refer to the assessment and plan.  Past Medical History:  Diagnosis Date  . Back pain   . Chronic bronchitis (HCC)   . Cirrhosis (HCC)   . Hepatitis C   . Hypertension   . Pancreatitis    Family History  Problem Relation Age of Onset  . Heart attack Mother   . Stroke Maternal Grandmother   . Bone cancer Maternal Grandfather   . Cancer Maternal Grandfather   . Pancreatic cancer Paternal Grandmother   . Colon cancer Neg Hx    Past Surgical History:  Procedure Laterality Date  . ABDOMINAL HYSTERECTOMY     partial   Social History   Tobacco Use  . Smoking status: Current Every Day Smoker    Packs/day: 1.00    Years: 40.00    Pack years: 40.00    Types: Cigarettes  . Smokeless tobacco: Never Used  Substance Use Topics  . Alcohol use: No  . Drug use: No    Review of Systems:   Review of Systems  Constitutional: Negative for chills and fever.  HENT: Negative for congestion.   Eyes: Negative for blurred vision and double vision.  Respiratory: Positive for cough, sputum production and wheezing. Negative for shortness of breath.   Cardiovascular: Negative for chest pain and leg swelling.  Gastrointestinal: Positive for constipation. Negative for abdominal pain, diarrhea, nausea and vomiting.  Genitourinary: Negative for dysuria and hematuria.  Musculoskeletal: Positive for joint pain.  Neurological: Positive for dizziness and headaches.    Physical Exam:  Vitals:   12/01/17 1503  BP: 134/76  Pulse: 74  Temp: 98 F (36.7 C)  TempSrc: Oral  SpO2: 98%  Weight: 224 lb 1.6 oz (101.7 kg)  Height: 5\' 7"  (1.702 m)   General: sitting comfortably, NAD HEENT: [place]/AT, EOMI, no scleral icterus, PERRL Cardiac: RRR, No R/M/G  appreciated Pulm: normal effort, no increased work of breathing, wheezing appreciated in right middle lung field otherwise clear to auscultation Abd: soft, non tender, non distended, BS normal Ext: extremities well perfused, no peripheral edema Neuro: alert and oriented X3, cranial nerves II-XII grossly intact   Assessment & Plan:   See Encounters Tab for problem based charting.  Patient discussed with Dr. Rogelia BogaButcher
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
## 2017-12-01 NOTE — Assessment & Plan Note (Addendum)
Colorectal cancer screening:  Patient had a normal colonoscopy in 2015 with repeat recommended in 10 years.  Cervical cancer screening: Patient reports having a total abdominal hysterectomy including the removal of her uterus and cervix secondary to menorrhagia and fibroids.   Lipid and diabetes screening: No recent lipid panel.  Last hemoglobin A1c was in 2014 and was 5.9. Repeat A1C 5.7 today.  Patient's mother died of heart attack at age 61.  Given the patient's BMI and significant cardiac family history we will check a lipid panel and hemoglobin A1c today.  Plan Pre-diabetes:  -Encourage diet and exercise  Addendum 12/03/2017: Lipid Panel     Component Value Date/Time   CHOL 190 12/01/2017 1636   TRIG 168 (H) 12/01/2017 1636   HDL 31 (L) 12/01/2017 1636   CHOLHDL 6.1 (H) 12/01/2017 1636   CHOLHDL 5.3 11/30/2012 1808   VLDL 31 11/30/2012 1808   LDLCALC 125 (H) 12/01/2017 1636  ASCVD risk score 14.2%, which is intermediate risk.  Patient would benefit from a moderate intensity statin.  Plan to prescribe Lipitor 10 mg daily if patient amenable.

## 2017-12-01 NOTE — Assessment & Plan Note (Addendum)
Patient reports cough with green sputum production, chest tightness, and wheezing for several weeks duration, which is triggered by her viral upper respiratory infection/cold.  She states she typically requires antibiotic improves.  She has never been on any inhalers.  Denies formal diagnosis of COPD.  Vital signs are stable, patient saturating high 90s on room air.  Patient in no acute respiratory distress. Respiratory exam with expiratory wheezing in the right middle lung field, otherwise clear to auscultation.   Patient may be having very mild presumed COPD exacerbation.  I do not think she requires any biotics at this time.  I provided the patient with albuterol inhaler sample and advised her to use it every 6-8 hours for the next 2 days.  Then to only use as it needed.  Plan: -Albuterol 2 puff every 6-8 hours for chest tightness and wheezing for 2 days duration, then PRN use only  -No antibiotics indicated at this time -If the patient's symptoms were to worsen or fail to improve would consider azithromycin and prednisone for 5 days, and possibly a chest x-ray  Medication Samples have been provided to the patient.  Drug name: Proventil       strength: 90 MCG per puff       qty: 1 LOT: [id number]  Exp.Date:05/16/2019  Dosing instructions: Take 2 puffs every 6 hours as needed for shortness of breath or wheezing  The patient has been instructed regarding the correct time, dose, and frequency of taking this medication, including desired effects and most common side effects.   Melanie Guzman 5:11 PM 12/01/2017

## 2017-12-02 LAB — LIPID PANEL
CHOLESTEROL TOTAL: 190 mg/dL (ref 100–199)
Chol/HDL Ratio: 6.1 ratio — ABNORMAL HIGH (ref 0.0–4.4)
HDL: 31 mg/dL — ABNORMAL LOW (ref >39)
LDL Calculated: 125 mg/dL — ABNORMAL HIGH (ref 0–99)
Triglycerides: 168 mg/dL — ABNORMAL HIGH (ref 0–149)
VLDL CHOLESTEROL CAL: 34 mg/dL (ref 5–40)

## 2017-12-02 LAB — HEPATITIS A ANTIBODY, TOTAL: Hep A Total Ab: NEGATIVE

## 2017-12-02 LAB — HEPATITIS A ANTIBODY, IGM: Hep A IgM: NEGATIVE

## 2017-12-03 ENCOUNTER — Telehealth: Payer: Self-pay | Admitting: Internal Medicine

## 2017-12-03 DIAGNOSIS — E785 Hyperlipidemia, unspecified: Secondary | ICD-10-CM | POA: Insufficient documentation

## 2017-12-03 MED ORDER — ATORVASTATIN CALCIUM 10 MG PO TABS: 10.0000 mg | ORAL_TABLET | Freq: Every day | ORAL | 11 refills | Status: DC

## 2017-12-03 NOTE — Telephone Encounter (Signed)
Call patient left message regarding lab results, specifically her lipid panel.  ASCVD risk score is 14.2% and she would benefit from a moderate intensity statin at this point.  Was calling patient to discuss this.  Advised to call back clinic.

## 2017-12-03 NOTE — Assessment & Plan Note (Signed)
Lipid Panel     Component Value Date/Time   CHOL 190 12/01/2017 1636   TRIG 168 (H) 12/01/2017 1636   HDL 31 (L) 12/01/2017 1636   CHOLHDL 6.1 (H) 12/01/2017 1636   CHOLHDL 5.3 11/30/2012 1808   VLDL 31 11/30/2012 1808   LDLCALC 125 (H) 12/01/2017 1636   ASCVD risk score 14.2%.  Patient will benefit from moderate intensity statin.  Plan: -Start Lipitor 10 mg daily

## 2017-12-03 NOTE — Telephone Encounter (Signed)
Call transferred to dr lacroce.

## 2017-12-03 NOTE — Addendum Note (Signed)
Addended by: Toney RakesLACROCE, Egor Fullilove J on: 12/03/2017 10:01 AM   Modules accepted: Orders

## 2017-12-25 ENCOUNTER — Telehealth: Payer: Self-pay | Admitting: Internal Medicine

## 2018-01-28 ENCOUNTER — Ambulatory Visit: Payer: BLUE CROSS/BLUE SHIELD | Admitting: Psychology

## 2018-01-29 ENCOUNTER — Other Ambulatory Visit (HOSPITAL_COMMUNITY)
Admission: RE | Admit: 2018-01-29 | Discharge: 2018-01-29 | Disposition: A | Discharge status: Home / Self Care | Payer: BLUE CROSS/BLUE SHIELD | Source: Ambulatory Visit | Attending: Internal Medicine | Admitting: Internal Medicine

## 2018-01-29 ENCOUNTER — Other Ambulatory Visit: Payer: Self-pay

## 2018-01-29 ENCOUNTER — Ambulatory Visit (INDEPENDENT_AMBULATORY_CARE_PROVIDER_SITE_OTHER): Payer: BLUE CROSS/BLUE SHIELD | Admitting: Internal Medicine

## 2018-01-29 VITALS — BP 163/95 | HR 70 | Temp 99.4°F | Wt 223.0 lb

## 2018-01-29 DIAGNOSIS — Z23 Encounter for immunization: Secondary | ICD-10-CM | POA: Diagnosis not present

## 2018-01-29 DIAGNOSIS — N898 Other specified noninflammatory disorders of vagina: Secondary | ICD-10-CM | POA: Insufficient documentation

## 2018-01-29 DIAGNOSIS — Z0000 Encounter for general adult medical examination without abnormal findings: Secondary | ICD-10-CM | POA: Insufficient documentation

## 2018-01-29 DIAGNOSIS — J40 Bronchitis, not specified as acute or chronic: Secondary | ICD-10-CM

## 2018-01-29 DIAGNOSIS — R05 Cough: Secondary | ICD-10-CM | POA: Insufficient documentation

## 2018-01-29 DIAGNOSIS — B1920 Unspecified viral hepatitis C without hepatic coma: Secondary | ICD-10-CM

## 2018-01-29 DIAGNOSIS — F329 Major depressive disorder, single episode, unspecified: Secondary | ICD-10-CM

## 2018-01-29 DIAGNOSIS — K7469 Other cirrhosis of liver: Secondary | ICD-10-CM

## 2018-01-29 DIAGNOSIS — I10 Essential (primary) hypertension: Secondary | ICD-10-CM | POA: Diagnosis not present

## 2018-01-29 DIAGNOSIS — R058 Other specified cough: Secondary | ICD-10-CM

## 2018-01-29 DIAGNOSIS — E785 Hyperlipidemia, unspecified: Secondary | ICD-10-CM | POA: Diagnosis not present

## 2018-01-29 DIAGNOSIS — Z79899 Other long term (current) drug therapy: Secondary | ICD-10-CM

## 2018-01-29 DIAGNOSIS — Z8619 Personal history of other infectious and parasitic diseases: Secondary | ICD-10-CM

## 2018-01-29 DIAGNOSIS — F32A Depression, unspecified: Secondary | ICD-10-CM

## 2018-01-29 HISTORY — DX: Other specified cough: R05.8

## 2018-01-29 MED ORDER — HEPATITIS B VAC RECOMB ADJ 20 MCG/0.5ML IM SOSY: 0.5000 mL | PREFILLED_SYRINGE | Freq: Once | INTRAMUSCULAR | 0 refills | Status: DC

## 2018-01-29 MED ORDER — SALINE SPRAY 0.65 % NA SOLN: 1.0000 | NASAL | 0 refills | Status: DC | PRN

## 2018-01-29 MED ORDER — FLUTICASONE PROPIONATE 50 MCG/ACT NA SUSP: 1.0000 | Freq: Every day | NASAL | 0 refills | Status: DC

## 2018-01-29 MED ORDER — LORATADINE 10 MG PO TABS: 10.0000 mg | ORAL_TABLET | Freq: Every day | ORAL | 2 refills | Status: DC | PRN

## 2018-01-29 NOTE — Progress Notes (Signed)
Heplisav-B 0.5 ml given in right deltoid but unable to find correct NDC in Epic to chart under immunizations at this time- given by Melanie Guzman at 3:15 P -  NDC [id number] syringe, exp date, 10/22/2019, Lot # [id number] documented late by Dorie RankSharon Diago Haik, RN, for Melanie Guzman, CMA -5: 24P, 01/29/18
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
## 2018-01-29 NOTE — Assessment & Plan Note (Signed)
Patient states that she has been having some vaginal itching for the past few weeks and some whitish colored discharge.  She is sexually active with one partner over the past year and has not used condoms.  The patient has had a neurectomy for benign reasons.  She has not had any previous history of abnormal Pap smears.  Therefore, the patient does not need any further Pap smears.  Assessment and plan -Ordered wet prep for BV, CV, gonorrhea, chlamydia

## 2018-01-29 NOTE — Assessment & Plan Note (Signed)
The patient's blood pressure during this visit was 156/95 and repeat was 163/95.  The patient states that she was told that she was hypertensive during several ED visits and was prescribed a medication at that time that she did not get refilled.  She does not recall the name of the medication.  Assessment and plan The patient is hypertensive and not at goal however she does not have repeated visits that show this.  Also the patient has had an approximate 20 pound weight gain over the past 2 to 3 years during which time she became hypertensive.  -Counseled the patient on diet and the need for at least 30 minutes of exercise daily -Referred the patient to [facility] for nutrition support

## 2018-01-29 NOTE — Patient Instructions (Signed)
It was a pleasure to see you today Melanie Guzman.   -Please start taking flonase, loratadine, and ocean nasal spray for your upper respiratory problems -Please continue taking atorvastatin daily  If you have any questions or concerns, please call our clinic at [phone] between 9am-5pm and after hours call [phone] and ask for the internal medicine resident on call. If you feel you are having a medical emergency please call 911.   Thank you, we look forward to help you remain healthy!  Melanie CourierVahini Omer Monter, MD Internal Medicine PGY2

## 2018-01-29 NOTE — Assessment & Plan Note (Signed)
The patient's ASCVD risk score is 14.2% the patient is started on moderate intensity Lipitor 10 mg daily

## 2018-01-29 NOTE — Assessment & Plan Note (Signed)
The patient states that for the past several weeks she has been having scratchy throat, runny eyes, productive cough, postnasal drip, sneezing, runny eyes, runny nose, ear itching.  Assessment and plan The patient likely has upper airway likely secondary to allergies.  Therefore prescribed Flonase, Ocean nasal spray, loratadine.

## 2018-01-29 NOTE — Assessment & Plan Note (Signed)
The patient currently completed treatment for hepatitis C.  She had her test of cure visit and was deemed to have been..  She also had a right upper quadrant ultrasound June 2019 which found changes consistent with cirrhosis of the liver.  There were no focal masses noted and no acute abnormalities.  -Given hepatitis A vaccination -Given HEPLISAV-B vaccination, she should return in 4 weeks for next series
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
## 2018-01-29 NOTE — Assessment & Plan Note (Signed)
-  Referred for bilateral screening mammogram

## 2018-01-30 ENCOUNTER — Other Ambulatory Visit: Payer: Self-pay | Admitting: Internal Medicine

## 2018-01-30 DIAGNOSIS — R921 Mammographic calcification found on diagnostic imaging of breast: Secondary | ICD-10-CM

## 2018-02-01 ENCOUNTER — Telehealth: Payer: Self-pay

## 2018-02-01 LAB — CERVICOVAGINAL ANCILLARY ONLY
Bacterial vaginitis: NEGATIVE
CHLAMYDIA, DNA PROBE: NEGATIVE
Candida vaginitis: NEGATIVE
Neisseria Gonorrhea: NEGATIVE
TRICH (WINDOWPATH): NEGATIVE

## 2018-02-01 NOTE — Telephone Encounter (Signed)
[facility] - Left Msg 

## 2018-02-01 NOTE — Progress Notes (Signed)
Internal Medicine Clinic Attending  Case discussed with Dr. Chundi at the time of the visit.  We reviewed the resident's history and exam and pertinent patient test results.  I agree with the assessment, diagnosis, and plan of care documented in the resident's note. 

## 2018-02-04 ENCOUNTER — Telehealth: Payer: Self-pay

## 2018-02-04 DIAGNOSIS — F329 Major depressive disorder, single episode, unspecified: Secondary | ICD-10-CM

## 2018-02-04 DIAGNOSIS — F32A Depression, unspecified: Secondary | ICD-10-CM
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
## 2018-02-18 ENCOUNTER — Telehealth: Payer: Self-pay

## 2018-02-18 NOTE — Telephone Encounter (Signed)
[facility] - Left Msg 

## 2018-02-25 ENCOUNTER — Telehealth: Payer: Self-pay

## 2018-02-25 NOTE — Telephone Encounter (Signed)
Patient reports that she is walking out of the door and she will call me back when she is free to talk.

## 2018-03-02 ENCOUNTER — Telehealth: Payer: Self-pay

## 2018-03-02 DIAGNOSIS — F329 Major depressive disorder, single episode, unspecified: Secondary | ICD-10-CM

## 2018-03-02 DIAGNOSIS — F32A Depression, unspecified: Secondary | ICD-10-CM

## 2018-03-02 NOTE — Addendum Note (Signed)
Addended by: Neomia DearPOWERS, Yoshie Kosel E on: 03/02/2018 07:09 AM   Modules accepted: Orders

## 2018-03-03 ENCOUNTER — Ambulatory Visit
Admission: RE | Admit: 2018-03-03 | Discharge: 2018-03-03 | Disposition: A | Discharge status: Home / Self Care | Payer: BLUE CROSS/BLUE SHIELD | Source: Ambulatory Visit | Attending: Internal Medicine | Admitting: Internal Medicine

## 2018-03-03 ENCOUNTER — Other Ambulatory Visit: Payer: Self-pay | Admitting: Internal Medicine

## 2018-03-03 DIAGNOSIS — R921 Mammographic calcification found on diagnostic imaging of breast: Secondary | ICD-10-CM

## 2018-03-05 ENCOUNTER — Encounter: Payer: Self-pay | Admitting: Dietician

## 2018-03-05 ENCOUNTER — Ambulatory Visit (INDEPENDENT_AMBULATORY_CARE_PROVIDER_SITE_OTHER): Payer: BLUE CROSS/BLUE SHIELD | Admitting: Dietician

## 2018-03-05 ENCOUNTER — Other Ambulatory Visit: Payer: Self-pay

## 2018-03-05 ENCOUNTER — Ambulatory Visit (INDEPENDENT_AMBULATORY_CARE_PROVIDER_SITE_OTHER): Payer: BLUE CROSS/BLUE SHIELD | Admitting: Internal Medicine

## 2018-03-05 VITALS — BP 135/90 | HR 71 | Temp 98.9°F | Wt 222.0 lb

## 2018-03-05 DIAGNOSIS — E785 Hyperlipidemia, unspecified: Secondary | ICD-10-CM | POA: Diagnosis not present

## 2018-03-05 DIAGNOSIS — Z6834 Body mass index (BMI) 34.0-34.9, adult: Secondary | ICD-10-CM

## 2018-03-05 DIAGNOSIS — Z720 Tobacco use: Secondary | ICD-10-CM

## 2018-03-05 DIAGNOSIS — K7469 Other cirrhosis of liver: Secondary | ICD-10-CM

## 2018-03-05 DIAGNOSIS — E038 Other specified hypothyroidism: Secondary | ICD-10-CM

## 2018-03-05 DIAGNOSIS — Z0000 Encounter for general adult medical examination without abnormal findings: Secondary | ICD-10-CM

## 2018-03-05 DIAGNOSIS — M7541 Impingement syndrome of right shoulder: Secondary | ICD-10-CM

## 2018-03-05 DIAGNOSIS — F17210 Nicotine dependence, cigarettes, uncomplicated: Secondary | ICD-10-CM

## 2018-03-05 DIAGNOSIS — M25511 Pain in right shoulder: Secondary | ICD-10-CM | POA: Insufficient documentation

## 2018-03-05 DIAGNOSIS — I10 Essential (primary) hypertension: Secondary | ICD-10-CM | POA: Diagnosis not present

## 2018-03-05 DIAGNOSIS — F32A Depression, unspecified: Secondary | ICD-10-CM

## 2018-03-05 DIAGNOSIS — B1920 Unspecified viral hepatitis C without hepatic coma: Secondary | ICD-10-CM

## 2018-03-05 DIAGNOSIS — Z713 Dietary counseling and surveillance: Secondary | ICD-10-CM | POA: Diagnosis not present

## 2018-03-05 DIAGNOSIS — E669 Obesity, unspecified: Secondary | ICD-10-CM

## 2018-03-05 DIAGNOSIS — F419 Anxiety disorder, unspecified: Secondary | ICD-10-CM

## 2018-03-05 DIAGNOSIS — Z23 Encounter for immunization: Secondary | ICD-10-CM | POA: Diagnosis not present

## 2018-03-05 DIAGNOSIS — F329 Major depressive disorder, single episode, unspecified: Secondary | ICD-10-CM

## 2018-03-05 DIAGNOSIS — E02 Subclinical iodine-deficiency hypothyroidism: Secondary | ICD-10-CM | POA: Diagnosis not present

## 2018-03-05 DIAGNOSIS — E039 Hypothyroidism, unspecified: Secondary | ICD-10-CM | POA: Diagnosis not present

## 2018-03-05 DIAGNOSIS — Z79899 Other long term (current) drug therapy: Secondary | ICD-10-CM

## 2018-03-05 MED ORDER — AMLODIPINE BESYLATE 5 MG PO TABS: 5.0000 mg | ORAL_TABLET | Freq: Every day | ORAL | 0 refills | Status: DC

## 2018-03-05 NOTE — Assessment & Plan Note (Signed)
The patient was given heplisav-b vaccination on previous encounter in 01/29/18. She is due for next series today. Patient was checked for hep a ab igM and hep a total which were both negative.  -Patient got second heplisav B vaccination at today's visit

## 2018-03-05 NOTE — Assessment & Plan Note (Addendum)
The patient states that she has been having right shoulder pain that is intermittently present when she uses her right arm a lot. She has had the pain for the past year.  On examination, the patient had good upper extremity strength in bilateral sides. She had some mild tenderness with internal rotation on right shoulder.   Assessment and plan  The patient likely has mild shoulder impingement. Recommended over the counter nsaids for relief. Will continue to monitor.

## 2018-03-05 NOTE — Patient Instructions (Signed)
Please read over the booklet and information provided to you today.  Think about what change is most important to you right now to lower your blood pressure and improve your health- yes quitting smoking counts!  Make a list of your questions and bring with you to our next appointment.    Melanie Guzman [phone]

## 2018-03-05 NOTE — Assessment & Plan Note (Signed)
The patient's blood pressure during this visit was 150/88 and repeat was 135/90. The patient is currently not on any anti-hypertensives and was rather recommended lifestyle modifications at previous visit.  BP Readings from Last 3 Encounters:  03/05/18 135/90  01/29/18 (!) 163/95  12/01/17 134/76   Assessment and plan  The patient continues to have elevated blood pressure. She is obese with as bmi of 34.7 and unfortunately has only lost 1 lb since previous visit. She is also a current smoker which are some other secondary causes of her elevated blood pressure. Due to the patient's blood pressure not being at goal, will start her on amlodipine 5mg  daily as it has been shown to be beneficial in African Americans. Also the patient does not have any other concomittant illnesses that will benefit from other anti-hypertensive regimens.

## 2018-03-05 NOTE — Patient Instructions (Signed)
It was a pleasure to see you today Melanie Guzman. Your blood pressure continued to be elevated during this visit and it if for that reason that I started you on amlodipine 5mg  daily. Please continue to eat healthy and exercise. You got your flu shot and your hepislav vaccination during this visit. Thank you!  If you have any questions or concerns, please call our clinic at [phone] between 9am-5pm and after hours call [phone] and ask for the internal medicine resident on call. If you feel you are having a medical emergency please call 911.   Thank you, we look forward to help you remain healthy!  Melanie CourierVahini Evianna Chandran, MD Internal Medicine PGY2

## 2018-03-05 NOTE — Assessment & Plan Note (Signed)
Patient has a ASCVD risk score of 14.2. Will continue moderate intensity atorvastatin 10mg  qd.

## 2018-03-05 NOTE — Progress Notes (Signed)
  Medical Nutrition Therapy:  Appt start time: 1530 end time:  1615. Visit # 1  Assessment:  Primary concerns today: high blood pressure/healhty lifestyle.  [name] was referred for high blood pressure and also wants to decrease her weight. She is thinking about quitting smoking. She is also thinking abut walking for exercise and has made a few changes to her diet over the last month. She reports high stress and difficulty coping with challenges in her life the past twp years. She showed intersted in learning about a healthy lifestyle to be around for her grandchildren . She has supportive friends and family members Preferred Learning Style: No preference indicated  Learning Readiness: Contemplating  ANTHROPOMETRICS: Estimated body mass index is 34.77 kg/m as calculated from the following:   Height as of 12/01/17: 5\' 7"  (1.702 m).   Weight as of an earlier encounter on 03/05/18: 222 lb (100.7 kg).  WEIGHT HISTORY:  Wt Readings from Last 5 Encounters:  03/05/18 222 lb (100.7 kg)  01/29/18 223 lb (101.2 kg)  12/01/17 224 lb 1.6 oz (101.7 kg)  03/06/17 214 lb 8 oz (97.3 kg)  05/18/16 220 lb (99.8 kg)   SLEEP:need to assess at future visit  DIETARY INTAKE:  Dining Out (times/week): need to assess at future visit B ( AM): fruit and yogurt with granola D ( PM): Malaw[place]urkey, chicken or fish fried in coconut oil, mashed potatoes, greens  Usual physical activity: need to assess at future visit  Progress Towards Goal(s):  In progress.   Nutritional Diagnosis:  NB-1.1 Food and nutrition-related knowledge deficit As related to lack of prior training on healthy lifestyle planning.  As evidenced by her report and questions..    Intervention:  Nutrition education about [place] quitline, healthy, low salt meal plannig and benefits of physical activity. Coordination of care: none  Teaching Method Utilized:Visual,Auditory,Hands on Handouts given during visit include:AVS, [place] Quitline, Hypertension  booklet Barriers to learning/adherence to lifestyle change: competing values, stress Demonstrated degree of understanding via:  Teach Back   Monitoring/Evaluation:  Dietary intake, exercise, and body weight in 3 week(s). Norm Parcelonna Keno Caraway, RD 03/05/2018 4:29 PM.

## 2018-03-05 NOTE — Progress Notes (Signed)
   CC: Hypertension Follow Up  HPI:  Ms.Chailyn Glasco is a 62 y.o. with Hep C related compensated cirrhosis and depression. Please see problem based charting for evaluation, assessment, and plan.  Past Medical History:  Diagnosis Date  . Back pain   . Chronic bronchitis (HCC)   . Cirrhosis (HCC)   . Hepatitis C   . Hypertension   . Pancreatitis    Review of Systems:    Review of Systems  Constitutional: Positive for malaise/fatigue. Negative for weight loss.  Musculoskeletal: Positive for joint pain (right shoulder).  Psychiatric/Behavioral: The patient is nervous/anxious.    Physical Exam:  Vitals:   03/05/18 1422 03/05/18 1429  BP: (!) 150/88 135/90  Pulse: 73 71  Temp: 98.9 F (37.2 C)   TempSrc: Oral   SpO2: 99%   Weight: 222 lb (100.7 kg)    Physical Exam  Constitutional: She appears well-developed and well-nourished. No distress.  HENT:  Head: Normocephalic and atraumatic.  Eyes: Conjunctivae are normal.  Cardiovascular: Normal rate, regular rhythm and normal heart sounds.  Respiratory: Effort normal and breath sounds normal. No respiratory distress. She has no wheezes.  GI: Soft. Bowel sounds are normal. She exhibits no distension. There is no tenderness.  Musculoskeletal: She exhibits no edema.  5/5 bilateral upper extremities. Mild tenderness with internal rotation  Neurological: She is alert.  Skin: She is not diaphoretic. No erythema.  Psychiatric: Her behavior is normal. Judgment and thought content normal.     Assessment & Plan:   See Encounters Tab for problem based charting.  Patient discussed with Dr. Cleda DaubE. Hoffman
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
## 2018-03-05 NOTE — Assessment & Plan Note (Signed)
Influenza vaccine given during this visit

## 2018-03-05 NOTE — BH Specialist Note (Signed)
[place] [facility] Telephone Follow-up  MRN: [id number] NAME: Melanie Guzman Date: 03/05/18   Total time: 30 minutes Call number: 2/6  Reason for call today: Reason for Contact: PHQ9-4 weeks  PHQ-9 Scores:  Depression screen [facility] 2/9 03/05/2018 02/04/2018 02/04/2018 01/29/2018 12/01/2017  Decreased Interest 3 2 1 1  0  Down, Depressed, Hopeless 3 2 2 1 1   PHQ - 2 Score 6 4 3 2 1   Altered sleeping 1 2 1  0 -  Tired, decreased energy 2 0 0 1 -  Change in appetite 1 0 0 0 -  Feeling bad or failure about yourself  3 3 0 1 -  Trouble concentrating 0 1 0 0 -  Moving slowly or fidgety/restless 0 0 0 0 -  Suicidal thoughts 0 0 0 0 -  PHQ-9 Score 13 10 4 4  -  Difficult doing work/chores Somewhat difficult Somewhat difficult - Not difficult at all -    GAD: GAD 7 : Generalized Anxiety Score 03/05/2018 02/04/2018  Nervous, Anxious, on Edge 3 2  Control/stop worrying 3 2  Worry too much - different things 2 2  Trouble relaxing 1 1  Restless 1 1  Easily annoyed or irritable 1 1  Afraid - awful might happen 3 2  Total GAD 7 Score 14 11  Anxiety Difficulty Somewhat difficult Somewhat difficult     Stress Current stressors: Current Stressors: Finances, Job loss/unemployment Sleep: Sleep: No problems Appetite: Appetite: No problems Coping ability: Coping ability: Exhausted, Overwhelmed  Patient taking medications as prescribed: Patient taking medications as prescribed: No  Current medications:  Outpatient Encounter Medications as of 03/20/18  Medication Sig  . atorvastatin (LIPITOR) 10 MG tablet Take 1 tablet (10 mg total) by mouth daily.  Marland Kitchen loratadine (CLARITIN) 10 MG tablet Take 1 tablet (10 mg total) by mouth daily as needed for allergies.   No facility-administered encounter medications on file as of 20-Mar-2018.      Self-harm Behaviors Risk Assessment Self-harm risk factors: Self-harm risk factors: (None Reported) Patient endorses recent thoughts of harming self: Have you  recently had any thoughts about harming yourself?: No  [place] Suicide Severity Rating Scale: No flowsheet data found. C-SRSS 02/04/2018 03/20/18 2018/03/20  1. Wish to be Dead No No No  2. Suicidal Thoughts No No No  6. Suicide Behavior Question No No No     Danger to Others Risk Assessment Danger to others risk factors: Danger to Others Risk Factors: No risk factors noted Patient endorses recent thoughts of harming others: Notification required: No need or identified person    Substance Use Assessment Patient recently consumed alcohol:  No  Alcohol Use Disorder Identification Test (AUDIT):  Alcohol Use Disorder Test (AUDIT) 02/04/2018 03-20-18  1. How often do you have a drink containing alcohol? 0 0  2. How many drinks containing alcohol do you have on a typical day when you are drinking? 0 0  3. How often do you have six or more drinks on one occasion? 0 0  AUDIT-C Score 0 0  Intervention/Follow-up AUDIT Score <7 follow-up not indicated AUDIT Score <7 follow-up not indicated   Patient recently used drugs:  No    Goals, Interventions and Follow-up Plan Goals: Increase healthy adjustment to current life circumstances Interventions: Brief CBT and Supportive Counseling Follow-up Plan: [facility] Phone follow Up   Summary:   Follow Up - Patient reports an increase in his PHQ and GAD score.  Patient reports that her financial constraints causes an increase in her  depressive episodes.  Patient's son and daughter have been supporting her financially and now her son is not able to spend her money to pay for her rent.  Therefore, her daughter has to pay all of her bills for her.    Patient reports that due to limited finances she is not able to obtain a bus ticket in order to go on interviews.  Patient reports that she has limited finances for food.   Writer provided the patient with resource information to the food pantry.  Writer provided the patient with referral information to [employer],     Patient denies SI/HI/Psychosis/Substance Abuse. If your symptoms worsen or you have thoughts of suicide/homicide, PLEASE SEEK IMMEDIATE MEDICAL ATTENTION.  You may always call:  National Suicide Hotline: (937)103-0541719 834 7746;  [place] Crisis Line: (832) 259-68663013871079;  Crisis Recovery in [place]: [phone].  These are available 24 hours a day, 7 days a week.   During the next session the patient will follow up with employment security commission for possible job leads. Patient will explore the possibility of obtaining her social security benefits early.      Phillip HealStevenson, Dashay Giesler LaVerne, LCAS-A
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
## 2018-03-06 LAB — TSH: TSH: 0.327 u[IU]/mL — ABNORMAL LOW (ref 0.450–4.500)

## 2018-03-08 NOTE — Progress Notes (Signed)
Internal Medicine Clinic Attending  Case discussed with Dr. Chundi at the time of the visit.  We reviewed the resident's history and exam and pertinent patient test results.  I agree with the assessment, diagnosis, and plan of care documented in the resident's note. 

## 2018-03-08 NOTE — Addendum Note (Signed)
Addended by: Lorenso CourierHUNDI, Verdon Ferrante on: 03/08/2018 02:27 PM   Modules accepted: Orders

## 2018-03-11 DIAGNOSIS — E038 Other specified hypothyroidism: Secondary | ICD-10-CM | POA: Insufficient documentation

## 2018-03-11 DIAGNOSIS — E039 Hypothyroidism, unspecified: Secondary | ICD-10-CM | POA: Insufficient documentation

## 2018-03-11 LAB — T4, FREE: Free T4: 1.38 ng/dL (ref 0.82–1.77)

## 2018-03-11 LAB — SPECIMEN STATUS REPORT

## 2018-03-11 NOTE — Assessment & Plan Note (Signed)
Patient's tsh=0.327 and free t4=1.38. Patient has subclinical hypothyroidism and will recheck thyroid levels in 6 months.

## 2018-03-23 ENCOUNTER — Telehealth: Payer: Self-pay

## 2018-03-23 NOTE — Telephone Encounter (Signed)
[facility] - Left Msg 

## 2018-03-24 ENCOUNTER — Telehealth: Payer: Self-pay | Admitting: Clinical

## 2018-03-24 NOTE — Telephone Encounter (Signed)
Patient left a message on [facility] voicemail, calling back since Melanie Guzman left a message for patient yesterday.  TC to patient today returning patient's call.  No answer. This [facility] specialist left message to call back with name and contact information.

## 2018-03-24 NOTE — Telephone Encounter (Signed)
Calianne called back and she reported she is ok.  She wanted to follow up with Ava so this [facility] specialist will leave a message for Ava to call her back when Ava returns.

## 2018-03-25 ENCOUNTER — Telehealth: Payer: Self-pay | Admitting: Clinical

## 2018-03-25 NOTE — Telephone Encounter (Signed)
This [facility] specialist left message to call back with name and contact information. [facility] left message that Melanie Guzman was not available today but if she wanted to talk to someone today to call back at her convenience.

## 2018-03-29 ENCOUNTER — Ambulatory Visit: Payer: Self-pay | Admitting: Dietician

## 2018-03-30 ENCOUNTER — Telehealth: Payer: Self-pay

## 2018-03-30 NOTE — Telephone Encounter (Signed)
2nd attempt on 03-25-2018 3rd attempt on 03-30-2018

## 2018-04-01 ENCOUNTER — Ambulatory Visit (INDEPENDENT_AMBULATORY_CARE_PROVIDER_SITE_OTHER): Payer: BLUE CROSS/BLUE SHIELD | Admitting: Internal Medicine

## 2018-04-01 ENCOUNTER — Telehealth: Payer: Self-pay

## 2018-04-01 ENCOUNTER — Other Ambulatory Visit: Payer: Self-pay

## 2018-04-01 DIAGNOSIS — J069 Acute upper respiratory infection, unspecified: Secondary | ICD-10-CM

## 2018-04-01 DIAGNOSIS — Z8701 Personal history of pneumonia (recurrent): Secondary | ICD-10-CM

## 2018-04-01 DIAGNOSIS — B9789 Other viral agents as the cause of diseases classified elsewhere: Secondary | ICD-10-CM

## 2018-04-01 HISTORY — DX: Acute upper respiratory infection, unspecified: J06.9

## 2018-04-01 NOTE — Telephone Encounter (Signed)
Requesting lab results. Please call pt back.  

## 2018-04-01 NOTE — Progress Notes (Signed)
   CC: upper URI  HPI:[name] is a 62 y.o. female who presents for evaluation of upper URI symptoms. Please see individual problem based A/P for details.  URI: Patient stated that she has been experiencing six days of cough, chest congestion and pain that is mild and only notable while coughing accompanied by an irritation in the throat, headache behind the eyes, clear copious rhinorrhea, malaise, a hint of nausea, chills, fever, and has experienced multiple prior episodes of pneumonia where in she felt similar but not the same as she does today. It has notably worsened in the past 36 hours overall.  Denied vomiting, diarrhea, abdominal pain, muscle aches.  Plan: I have recommended ibuprofen 400-600mg  no more than TID for muscle aches or fever Continue supportive measures with hydration  Strict return precautions given  Past Medical History:  Diagnosis Date  . Back pain   . Chronic bronchitis (HCC)   . Cirrhosis (HCC)   . Hepatitis C   . Hypertension   . Pancreatitis    Review of Systems:  ROS negative except as per HPI.  Physical Exam: Vitals:   04/01/18 1353  BP: (!) 148/87  Pulse: 97  Temp: 99.2 F (37.3 C)  TempSrc: Oral  SpO2: 97%  Weight: 225 lb 8 oz (102.3 kg)  Height: 5\' 7"  (1.702 m)   General: Patient is afebrile today, appears comfortable at rest.  membranes  HEENT: Bilateral ears are clear, cone of light is visible, no fluid level noted Eyes: PERRLA, not injected Nose: erythematous nasal passages bilaterally, no mucus noted Mouth: posterior oropharynx clear, no plaques or tonsillitis noted Cardio: RRR, no mrg's Pulmonary: CTA bilaterally  Assessment & Plan:   See Encounters Tab for problem based charting.  Patient discussed with Dr. Sandre Kitty

## 2018-04-01 NOTE — Assessment & Plan Note (Signed)
URI: Patient stated that she has been experiencing six days of cough, chest congestion and pain that is mild and only notable while coughing accompanied by an irritation in the throat, headache behind the eyes, clear copious rhinorrhea, malaise, a hint of nausea, chills, fever, and has experienced multiple prior episodes of pneumonia where in she felt similar but not the same as she does today. It has notably worsened in the past 36 hours overall.  Denied vomiting, diarrhea, abdominal pain, muscle aches.  Plan: I have recommended ibuprofen 400-600mg  no more than TID for muscle aches or fever Continue supportive measures with hydration  Strict return precautions given

## 2018-04-01 NOTE — Telephone Encounter (Signed)
Attempted to call patient. She was not available, left message for her to call back.

## 2018-04-01 NOTE — Patient Instructions (Signed)
FOLLOW-UP INSTRUCTIONS When: If your symptoms begin to improve and then suddenly worsen please call and schedule with [place]  I have recommended Ibuprofen 400mg  to up to 600mg  no more than three times per day for fever or muscle aches.  Today we discussed viral upper respiratory infection or common cold.   As always if your symptoms worsen, fail to improve, or improve and then suddenly become more severe, please notify our office or visit the local ER if we are unavailable. Symptoms including high fever, confusion, severe headache, generalized weakness, should not be ignored and should encourage you to visit the ED if we are unavailable by phone or the symptoms are severe.  Thank you for your visit to the [facility] today. If you have any questions or concerns please call us at [phone].

## 2018-04-06 NOTE — Progress Notes (Signed)
Internal Medicine Clinic Attending  Case discussed with Dr. Harbrecht at the time of the visit.  We reviewed the resident's history and exam and pertinent patient test results.  I agree with the assessment, diagnosis, and plan of care documented in the resident's note.  Alexander Raines, M.D., Ph.D.  

## 2018-04-08 ENCOUNTER — Telehealth: Payer: Self-pay

## 2018-04-08 DIAGNOSIS — F32A Depression, unspecified: Secondary | ICD-10-CM

## 2018-04-08 DIAGNOSIS — F329 Major depressive disorder, single episode, unspecified: Secondary | ICD-10-CM

## 2018-04-08 NOTE — BH Specialist Note (Signed)
[place] Virtual BH Telephone Follow-up  MRN: [id number] NAME: Melanie Guzman Date: 2018/04/18   Total time: 30 minutes Call number: 3/6  Reason for call today:  VBH Phone Follow UP   PHQ-9 Scores:  Depression screen [facility] 2/9 2018-04-18 03/05/2018 03/05/2018 02/04/2018 02/04/2018  Decreased Interest 2 1 3 2 1   Down, Depressed, Hopeless 2 1 3 2 2   PHQ - 2 Score 4 2 6 4 3   Altered sleeping 1 0 1 2 1   Tired, decreased energy 2 1 2  0 0  Change in appetite 0 0 1 0 0  Feeling bad or failure about yourself  - 1 3 3  0  Trouble concentrating 2 1 0 1 0  Moving slowly or fidgety/restless 0 0 0 0 0  Suicidal thoughts 0 0 0 0 0  PHQ-9 Score 9 5 13 10 4   Difficult doing work/chores Somewhat difficult - Somewhat difficult Somewhat difficult -     GAD-7 Scores:  GAD 7 : Generalized Anxiety Score 04-18-2018 03/05/2018 02/04/2018  Nervous, Anxious, on Edge 3 3 2   Control/stop worrying 2 3 2   Worry too much - different things 2 2 2   Trouble relaxing 2 1 1   Restless 2 1 1   Easily annoyed or irritable 1 1 1   Afraid - awful might happen 2 3 2   Total GAD 7 Score 14 14 11   Anxiety Difficulty Somewhat difficult Somewhat difficult Somewhat difficult    Stress Current stressors:  Unemployed, unsafe living environment  Sleep:  No issues Appetite:  Varies Coping ability:  Exhausted, Overwhelmed  Patient taking medications as prescribed:  Patient declined psychiatric medication.  Current medications:  Outpatient Encounter Medications as of 2018/04/18  Medication Sig  . amLODipine (NORVASC) 5 MG tablet Take 1 tablet (5 mg total) by mouth daily.  Marland Kitchen atorvastatin (LIPITOR) 10 MG tablet Take 1 tablet (10 mg total) by mouth daily.  . fluticasone (FLONASE) 50 MCG/ACT nasal spray Place 1 spray into both nostrils daily.  Marland Kitchen loratadine (CLARITIN) 10 MG tablet Take 1 tablet (10 mg total) by mouth daily as needed for allergies.  . sodium chloride (OCEAN) 0.65 % SOLN nasal spray Place 1 spray into both  nostrils as needed for congestion.   No facility-administered encounter medications on file as of 2018/04/18.      Self-harm Behaviors Risk Assessment Self-harm risk factors:  None Reported Patient endorses recent thoughts of harming self:  None Reported  [place] Suicide Severity Rating Scale: No flowsheet data found. C-SRSS 02/04/2018 03/02/2018 03/02/2018 04-18-2018  1. Wish to be Dead No No No No  2. Suicidal Thoughts No No No No  6. Suicide Behavior Question No No No No     Danger to Others Risk Assessment Danger to others risk factors:  None Reported Patient endorses recent thoughts of harming others:  None Reported    Substance Use Assessment Patient recently consumed alcohol:  None Reported  Alcohol Use Disorder Identification Test (AUDIT):  Alcohol Use Disorder Test (AUDIT) 02/04/2018 03/02/2018 2018-04-18  1. How often do you have a drink containing alcohol? 0 0 0  2. How many drinks containing alcohol do you have on a typical day when you are drinking? 0 0 0  3. How often do you have six or more drinks on one occasion? 0 0 0  AUDIT-C Score 0 0 0  Intervention/Follow-up AUDIT Score <7 follow-up not indicated AUDIT Score <7 follow-up not indicated AUDIT Score <7 follow-up not indicated   Patient recently used drugs:  None  Reported    Goals, Interventions and Follow-up Plan Goals: Increase healthy adjustment to current life circumstances Interventions: Behavioral Activation, Supportive Counseling and Link to [employer] Follow-up Plan: VBH Phone Follow UP   Summary:   Follow UP - Patient is a 62 year old female.  Patient has an increase in her PHQ score and her GAD score remains the same. Patient reports increased depression associated with not being able to obtain a job, feeling unsafe in her current living environment.  Writer discussed mindfulness techniques.    Patient reports that she has a job lead at [employer].  Patient reports improved sleep.   Patient reports incorporating sleep hygiene techniques. Patient declined psychiatric medication.  Patient reports spending time with a friend as a means of self care. Patient reports that there are not a lot of things that she can do dur to limited finances.  Patient reports that her daughter is still assisting her with paying her rent.    Patient denies SI/HI/Psychosis/Substance Abuse. If your symptoms worsen or you have thoughts of suicide/homicide, PLEASE SEEK IMMEDIATE MEDICAL ATTENTION.  You may always call:  National Suicide Hotline: 870-212-6875;  [place] Crisis Line: 7547314478;  Crisis Recovery in [place]: 419-883-3919.  These are available 24 hours a day, 7 days a week.   Writer provided patient with the following community resources for: 1.   Housing - [employer] and the [facility] Program  2.   Transportation- Wheels for [employer] through the [employer] Program [facility]) 3.   Unemployment Office ([facility])  4.   Medical laboratory scientific officer at [employer] of the [place] and [employer] [place], Anastacia Reinecke La[place]ne, LCAS-A

## 2018-04-28 ENCOUNTER — Telehealth: Payer: Self-pay

## 2018-04-28 NOTE — Telephone Encounter (Signed)
2nd attempt - VBH   

## 2018-05-03 ENCOUNTER — Telehealth: Payer: Self-pay

## 2018-05-03 DIAGNOSIS — F32A Depression, unspecified: Secondary | ICD-10-CM

## 2018-05-03 DIAGNOSIS — F329 Major depressive disorder, single episode, unspecified: Secondary | ICD-10-CM
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
## 2018-05-05 NOTE — Progress Notes (Signed)
Melanie Guzman is a [age] y.o. year old female with a history of depression, hypertension,hyperlipidemia, cirrhosis with history of Hep C by history, who is referred for depression.   Patient continues to decline pharmacological treatment. Elevated PHQ9, GAD 7 in the context of unemployment and transportation issues. Patient may have underlying PTSD [facility]. Will consider referral for face to face therapy. BH specialist to discuss treatment options with the patient.

## 2018-05-06 NOTE — Progress Notes (Signed)
   CC: Hypertension follow-up  HPI:  [name] is a 62 y.o. with hypertension, subclinical hypothyroidism, and hyperlipidemia who presents for hypertension follow-up. Please see problem based charting for evaluation, assessment, and plan.   Past Medical History:  Diagnosis Date  . Back pain   . Chronic bronchitis (HCC)   . Cirrhosis (HCC)   . Hepatitis C   . Hypertension   . Pancreatitis    Review of Systems:    Denies nausea, vomiting, chest pain, sob Has anxiety  Physical Exam:  Vitals:   05/07/18 1546  BP: 139/84  Pulse: 80  Temp: 98.4 F (36.9 C)  TempSrc: Oral  SpO2: 98%  Weight: 225 lb 14.4 oz (102.5 kg)  Height: 5\' 7"  (1.702 m)   Physical Exam  Constitutional: Appears well-developed and well-nourished. No distress.  HENT:  Head: Normocephalic and atraumatic.  Eyes: Conjunctivae are normal.  Cardiovascular: Normal rate, regular rhythm and normal heart sounds.  Respiratory: Effort normal and breath sounds normal. No respiratory distress. No wheezes.  GI: Soft. Bowel sounds are normal. No distension. There is no tenderness.  Musculoskeletal: No edema.  Neurological: Is alert.  Skin: Not diaphoretic. No erythema.  Psychiatric: Normal mood and affect. Behavior is normal. Judgment and thought content normal.    Assessment & Plan:   See Encounters Tab for problem based charting.  Patient discussed with Dr. Oswaldo DoneVincent

## 2018-05-07 ENCOUNTER — Other Ambulatory Visit: Payer: Self-pay

## 2018-05-07 ENCOUNTER — Encounter: Payer: Self-pay | Admitting: Internal Medicine

## 2018-05-07 ENCOUNTER — Encounter (INDEPENDENT_AMBULATORY_CARE_PROVIDER_SITE_OTHER): Payer: Self-pay

## 2018-05-07 ENCOUNTER — Ambulatory Visit (INDEPENDENT_AMBULATORY_CARE_PROVIDER_SITE_OTHER): Payer: BLUE CROSS/BLUE SHIELD | Admitting: Internal Medicine

## 2018-05-07 VITALS — BP 139/84 | HR 80 | Temp 98.4°F | Ht 67.0 in | Wt 225.9 lb

## 2018-05-07 DIAGNOSIS — I10 Essential (primary) hypertension: Secondary | ICD-10-CM | POA: Diagnosis not present

## 2018-05-07 DIAGNOSIS — E785 Hyperlipidemia, unspecified: Secondary | ICD-10-CM | POA: Diagnosis not present

## 2018-05-07 DIAGNOSIS — F329 Major depressive disorder, single episode, unspecified: Secondary | ICD-10-CM | POA: Diagnosis not present

## 2018-05-07 DIAGNOSIS — Z720 Tobacco use: Secondary | ICD-10-CM

## 2018-05-07 DIAGNOSIS — F32A Depression, unspecified: Secondary | ICD-10-CM

## 2018-05-07 DIAGNOSIS — Z79899 Other long term (current) drug therapy: Secondary | ICD-10-CM

## 2018-05-07 DIAGNOSIS — E038 Other specified hypothyroidism: Secondary | ICD-10-CM

## 2018-05-07 DIAGNOSIS — E02 Subclinical iodine-deficiency hypothyroidism: Secondary | ICD-10-CM

## 2018-05-07 DIAGNOSIS — F17210 Nicotine dependence, cigarettes, uncomplicated: Secondary | ICD-10-CM

## 2018-05-07 DIAGNOSIS — E039 Hypothyroidism, unspecified: Secondary | ICD-10-CM

## 2018-05-07 MED ORDER — BUPROPION HCL ER (SR) 150 MG PO TB12: 150.0000 mg | ORAL_TABLET | Freq: Two times a day (BID) | ORAL | 0 refills | Status: DC

## 2018-05-07 NOTE — Patient Instructions (Signed)
It was a pleasure to see you today Ms. Haertel. I am happy to see that your blood pressure has improved. Please continue to take amlodipine 5mg  daily. Please start taking bupropion for both smoking cessation and to help your mood. You will also be called from our behavioral health specialist for a visit.   If you have any questions or concerns, please call our clinic at [phone] between 9am-5pm and after hours call [phone] and ask for the internal medicine resident on call. If you feel you are having a medical emergency please call 911.   Thank you, we look forward to help you remain healthy!  Lorenso CourierVahini [place], MD Internal Medicine PGY2

## 2018-05-08 ENCOUNTER — Encounter: Payer: Self-pay | Admitting: Internal Medicine

## 2018-05-08 NOTE — Assessment & Plan Note (Signed)
The patient has been smoking 1ppd for several years. She mentioned that she is ready to start working on decreasing her tobacco use.   Assessment and plan  Suggested using chantix, but the patient was hesitant after we spoke about side effects (suicide risk and nightmares). Instead patient opted to be started on bupropion. Patient was started on bupropion 150mg  qd for 3 days and then bid thereafter. Applauded patient on her desire to quit smoking.
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
## 2018-05-08 NOTE — Assessment & Plan Note (Signed)
The patient's blood pressure during this visit was 139/84. The patient is currently taking amlodipine 5mg  qd. His last blood pressure visits are   BP Readings from Last 3 Encounters:  05/07/18 139/84  04/01/18 (!) 148/87  03/05/18 135/90   The patient does not report palpitations, dizziness, chest pain, sob.  Assessment and Plan The patient's blood pressure during this visit has improved from previous.  She states that she has been mostly adherent to her medication regimen with occasional missed doses.  Plan to continue the patient on current medication regimen of amlodipine 5 mg daily.  Counseled the patient on the importance of medication adherence and healthy lifestyle modifications.  I anticipate that smoking cessation will also help improve the patient's blood pressure.

## 2018-05-10 ENCOUNTER — Telehealth: Payer: Self-pay | Admitting: Licensed Clinical Social Worker

## 2018-05-10 NOTE — Telephone Encounter (Signed)
Patient returned the phone call from the office [facility]. Patient reported she needs to find someone willing to transport her to the office prior to scheduling her initial appointment. Patient plans to call the office back with a date and time after confirming transportation.

## 2018-05-10 NOTE — Telephone Encounter (Signed)
Left voicemail for the patient to contact the office back regarding a recent referral for Bh services.

## 2018-05-11 ENCOUNTER — Other Ambulatory Visit: Payer: Self-pay

## 2018-05-11 NOTE — Progress Notes (Signed)
Internal Medicine Clinic Attending  Case discussed with Dr. Chundi at the time of the visit.  We reviewed the resident's history and exam and pertinent patient test results.  I agree with the assessment, diagnosis, and plan of care documented in the resident's note. 

## 2018-05-11 NOTE — Telephone Encounter (Signed)
atorvastatin (LIPITOR) 10 MG tablet, REFILL REQUEST @  [facility] [address] - [address] [phone] (Phone) [phone] (Fax)

## 2018-05-11 NOTE — Telephone Encounter (Signed)
Pt has refills on Atorvastatin - pt called , no answer.  Return call from pt - stated she had called [facility].  I called [facility] - at first stated they do not have it; informed rx was refilled in June with 11 RF. After looking, stated he will get it ready for the pt.  Called pt back; informed rx should be ready later today.

## 2018-05-19 ENCOUNTER — Encounter: Payer: Self-pay | Admitting: Internal Medicine

## 2018-05-19 ENCOUNTER — Telehealth: Payer: Self-pay | Admitting: *Deleted

## 2018-05-19 ENCOUNTER — Ambulatory Visit (INDEPENDENT_AMBULATORY_CARE_PROVIDER_SITE_OTHER): Payer: BLUE CROSS/BLUE SHIELD | Admitting: Internal Medicine

## 2018-05-19 ENCOUNTER — Other Ambulatory Visit: Payer: Self-pay

## 2018-05-19 ENCOUNTER — Other Ambulatory Visit: Payer: Self-pay | Admitting: Nurse Practitioner

## 2018-05-19 VITALS — BP 157/88 | HR 77 | Temp 98.1°F | Ht 67.5 in | Wt 224.2 lb

## 2018-05-19 DIAGNOSIS — K7469 Other cirrhosis of liver: Secondary | ICD-10-CM

## 2018-05-19 DIAGNOSIS — J069 Acute upper respiratory infection, unspecified: Secondary | ICD-10-CM

## 2018-05-19 DIAGNOSIS — B9789 Other viral agents as the cause of diseases classified elsewhere: Principal | ICD-10-CM

## 2018-05-19 MED ORDER — OSELTAMIVIR PHOSPHATE 75 MG PO CAPS
75.0000 mg | ORAL_CAPSULE | Freq: Two times a day (BID) | ORAL | 0 refills | Status: AC
Start: 2018-05-19 — End: 2018-05-29

## 2018-05-19 NOTE — Telephone Encounter (Signed)
I agree

## 2018-05-19 NOTE — Telephone Encounter (Signed)
Call from pt - c/o dry cough, hot/cold sweats, shortness of breath which started yesterday. She has tried Thera-flu which has not helped. Offered her an appt stated she [facility] be [facility] since she has to ride the bus (has no other choice) .informed if condition worsens to go straight to ED; voiced understanding. Called pt back - concerned for her traveling by herself on the bus. Stated she called someone who will be bringing here.

## 2018-05-19 NOTE — Patient Instructions (Addendum)
Thank you for allowing us to care for you  Your symptom are consistent with the Flu - We have prescribed a course of tami-flu - You can use over the counter remedies as need - For congestion, we recommend nasal rinses - For body aches, you can use OTC pain relievers like Ibuprofen  Follow up as needed for worsening or severe symptoms   Sinus Rinse What is a sinus rinse? A sinus rinse is a home treatment. It rinses your sinuses with a mixture of salt and water (saline solution). Sinuses are air-filled spaces in your skull behind the bones of your face and forehead. They open into your nasal cavity. To do a sinus rinse, you will need:  Saline solution.  Neti pot or spray bottle. This releases the saline solution into your nose and through your sinuses. You can buy neti pots and spray bottles at: ? [facility] fundraiserYour local pharmacy. ? A health food store. ? Online.  When should I do a sinus rinse? A sinus rinse can help to clear your nasal cavity. It can clear:  Mucus.  Dirt.  Dust.  Pollen.  You may do a sinus rinse when you have:  A cold.  A virus.  Allergies.  A sinus infection.  A stuffy nose.  If you are considering a sinus rinse:  Ask your child's doctor before doing a sinus rinse on your child.  Do not do a sinus rinse if you have had: ? Ear or nasal surgery. ? An ear infection. ? Blocked ears.  How do I do a sinus rinse?  Wash your hands.  Disinfect your device using the directions that came with the device.  Dry your device.  Use the solution that comes with your device or one that is sold separately in stores. Follow the mixing directions on the package.  Fill your device with the amount of saline solution as stated in the device instructions.  Stand over a sink and tilt your head sideways over the sink.  Place the spout of the device in your upper nostril (the one closer to the ceiling).  Gently pour or squeeze the saline solution into the nasal  cavity. The liquid should drain to the lower nostril if you are not too congested.  Gently blow your nose. Blowing too hard may cause ear pain.  Repeat in the other nostril.  Clean and rinse your device with clean water.  Air-dry your device. Are there risks of a sinus rinse? Sinus rinse is normally very safe and helpful. However, there are a few risks, which include:  A burning feeling in the sinuses. This may happen if you do not make the saline solution as instructed. Make sure to follow all directions when making the saline solution.  Infection from unclean water. This is rare, but possible.  Nasal irritation.  This information is not intended to replace advice given to you by your health care provider. Make sure you discuss any questions you have with your health care provider. Document Released: 12/28/2013 Document Revised: 04/29/2016 Document Reviewed: 10/18/2013 Elsevier Interactive Patient Education  2017 [employer]

## 2018-05-19 NOTE — Progress Notes (Signed)
   CC: Viral URI, with Cough  HPI:  [name] is a 62 y.o. F with PMHx listed below presenting for Viral URI, with Cough. Please see the A&P for the status of the patient's chronic medical problems.  Patient state that for the past week she has been experiencing subjective fevers (not measured) and chills, body aches, cough productive of green sputum, rhinorrhea. She states she experienced and episode of SOB while shopping yesterday, which resolved spontaneously and has not recurred. She states she has tried taking TheraFlu at home with minimal improvement in symptoms. She endorses sick contacts stating that many people in her building are sick including the person who drives her to her appointments.  Past Medical History:  Diagnosis Date  . Back pain   . Chronic bronchitis (HCC)   . Cirrhosis (HCC)   . Hepatitis C   . Hypertension   . Pancreatitis   . Upper airway cough syndrome 01/29/2018  . URI (upper respiratory infection) 04/01/2018   Review of Systems: Performed and all others negative.  Physical Exam:  Vitals:   05/19/18 1021  BP: (!) 157/88  Pulse: 77  Temp: 98.1 F (36.7 C)  TempSrc: Oral  SpO2: 95%  Weight: 224 lb 3.2 oz (101.7 kg)  Height: 5' 7.5" (1.715 m)   Physical Exam  Constitutional: She appears well-developed and well-nourished.  Mildly uncomfortable appearing  Cardiovascular: Normal rate, regular rhythm, normal heart sounds and intact distal pulses.  Pulmonary/Chest: Effort normal and breath sounds normal. No respiratory distress.  Abdominal: Soft. Bowel sounds are normal. She exhibits no distension. There is no tenderness.  Musculoskeletal: She exhibits no edema or deformity.  Skin: Skin is warm and dry.   Assessment & Plan:   See Encounters Tab for problem based charting.  Patient discussed with Dr. Heide SparkNarendra

## 2018-05-19 NOTE — Assessment & Plan Note (Signed)
Patient state that for the past week she has been experiencing subjective fevers (not measured) and chills, body aches, cough productive of green sputum, rhinorrhea. She states she experienced and episode of SOB while shopping yesterday, which resolved spontaneously and has not recurred. She states she has tried taking TheraFlu at home with minimal improvement in symptoms. She endorses sick contacts stating that many people in her building are sick including the person who drives her to her appointments. On exam patient is afebrile with clear lungs sounds.   Presentation and history of significant sick contacts consistent with Flu (or other viral URI). Will treat supportively and give Rx for Tamiflu. - Tamiflu 75mg  BID x10d - OTC cough/cold remedies PRN - Nasal rinse for congestion - Tea with honey for cough - Return precautions given

## 2018-05-20 ENCOUNTER — Telehealth: Payer: Self-pay | Admitting: Licensed Clinical Social Worker

## 2018-05-20 NOTE — Telephone Encounter (Signed)
Patient was contacted for the second time to follow up on the [facility]. Patient did not answer, and a voice mail was left.  If we do not hear from the patient in the next few days, we will mail a letter regarding the referral.

## 2018-05-20 NOTE — Progress Notes (Signed)
Internal Medicine Clinic Attending  Case discussed with Dr. Melvin  at the time of the visit.  We reviewed the resident's history and exam and pertinent patient test results.  I agree with the assessment, diagnosis, and plan of care documented in the resident's note.  

## 2018-05-25 ENCOUNTER — Ambulatory Visit
Admission: RE | Admit: 2018-05-25 | Discharge: 2018-05-25 | Disposition: A | Discharge status: Home / Self Care | Payer: BLUE CROSS/BLUE SHIELD | Source: Ambulatory Visit | Attending: Nurse Practitioner | Admitting: Nurse Practitioner

## 2018-05-25 DIAGNOSIS — K7469 Other cirrhosis of liver: Secondary | ICD-10-CM

## 2018-05-26 ENCOUNTER — Ambulatory Visit: Payer: BLUE CROSS/BLUE SHIELD | Admitting: Licensed Clinical Social Worker

## 2018-05-26 ENCOUNTER — Encounter: Payer: Self-pay | Admitting: Licensed Clinical Social Worker

## 2018-05-26 DIAGNOSIS — F329 Major depressive disorder, single episode, unspecified: Secondary | ICD-10-CM

## 2018-05-26 DIAGNOSIS — F32A Depression, unspecified: Secondary | ICD-10-CM
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
## 2018-06-02 ENCOUNTER — Ambulatory Visit: Payer: Self-pay | Admitting: Licensed Clinical Social Worker

## 2018-06-03 ENCOUNTER — Ambulatory Visit: Payer: Self-pay | Admitting: Licensed Clinical Social Worker

## 2018-06-03 ENCOUNTER — Telehealth: Payer: Self-pay | Admitting: Licensed Clinical Social Worker

## 2018-06-03 NOTE — Telephone Encounter (Signed)
Patient called the office and needed to [facility] due to no [facility]. Patient did not want to reschedule until she can find a reliable ride to the office. Patient reported that she would contact the office back to schedule once she finds transportation.

## 2018-07-28 ENCOUNTER — Other Ambulatory Visit: Payer: Self-pay | Admitting: Internal Medicine

## 2018-07-28 DIAGNOSIS — I10 Essential (primary) hypertension: Secondary | ICD-10-CM

## 2018-07-29 ENCOUNTER — Other Ambulatory Visit: Payer: Self-pay

## 2018-07-29 NOTE — Telephone Encounter (Signed)
amLODipine (NORVASC) 5 MG tablet, refill request @  [facility] [address] [place], [place] - [address] [phone] (Phone) [phone] (Fax)

## 2018-07-29 NOTE — Telephone Encounter (Signed)
Was sent to [facility] 2/12, confirmed w/ pharm

## 2018-08-06 ENCOUNTER — Encounter: Payer: Self-pay | Admitting: Internal Medicine

## 2018-08-11 ENCOUNTER — Other Ambulatory Visit: Payer: Self-pay

## 2018-08-11 NOTE — Telephone Encounter (Signed)
Amlodipine 5mg  RX was sent on 07/28/18.  [facility] called, RX is at pharmacy ready for pick up.  Pt called and notified of above. SChaplin, RN,BSN

## 2018-08-11 NOTE — Telephone Encounter (Signed)
Requesting a refill on blood pressure med. Pt is using [facility] on [address].

## 2018-09-02 ENCOUNTER — Other Ambulatory Visit: Payer: Self-pay

## 2018-09-02 ENCOUNTER — Other Ambulatory Visit: Payer: Self-pay | Admitting: Internal Medicine

## 2018-09-02 ENCOUNTER — Ambulatory Visit
Admission: RE | Admit: 2018-09-02 | Discharge: 2018-09-02 | Disposition: A | Discharge status: Home / Self Care | Payer: BLUE CROSS/BLUE SHIELD | Source: Ambulatory Visit | Attending: Internal Medicine | Admitting: Internal Medicine

## 2018-09-02 DIAGNOSIS — R921 Mammographic calcification found on diagnostic imaging of breast: Secondary | ICD-10-CM

## 2018-09-10 ENCOUNTER — Encounter: Payer: BLUE CROSS/BLUE SHIELD | Admitting: Internal Medicine

## 2018-09-10 ENCOUNTER — Other Ambulatory Visit: Payer: Self-pay

## 2018-11-15 ENCOUNTER — Other Ambulatory Visit: Payer: Self-pay | Admitting: Internal Medicine

## 2018-11-15 DIAGNOSIS — I10 Essential (primary) hypertension: Secondary | ICD-10-CM

## 2018-11-15 NOTE — Telephone Encounter (Signed)
Needs refill on blood pressures amLODipine (NORVASC) 5 MG tablet , pt would like something for sinus and allergies medicine

## 2018-11-15 NOTE — Telephone Encounter (Signed)
Please advise patient that it would be better for her to be evaluated in acc in person, but can also do an acc televisit

## 2018-11-15 NOTE — Telephone Encounter (Signed)
Returned call to patient:  Pt requesting refill on amlodipine.   Pt also c/o post nasal drainage, worse in the morning upon wakening and making her nauseated.  Intermittent coughing up mucous - thick, light green.  Pt requesting ABX and something for the cough.  Informed pt she would need to be seen for evaluation in clinic and pt refuses, states she is not coming to hospital because of Covid.  Is open to telehealth visit.   LOV 05/19/18 Will forward to Dr. Delma Officer  Thanks, Fowler, RN,BSN

## 2018-11-16 ENCOUNTER — Ambulatory Visit (INDEPENDENT_AMBULATORY_CARE_PROVIDER_SITE_OTHER): Payer: BLUE CROSS/BLUE SHIELD | Admitting: Internal Medicine

## 2018-11-16 ENCOUNTER — Other Ambulatory Visit: Payer: Self-pay

## 2018-11-16 DIAGNOSIS — F17200 Nicotine dependence, unspecified, uncomplicated: Secondary | ICD-10-CM

## 2018-11-16 DIAGNOSIS — J309 Allergic rhinitis, unspecified: Secondary | ICD-10-CM

## 2018-11-16 DIAGNOSIS — J42 Unspecified chronic bronchitis: Secondary | ICD-10-CM

## 2018-11-16 DIAGNOSIS — J302 Other seasonal allergic rhinitis: Secondary | ICD-10-CM

## 2018-11-16 MED ORDER — SODIUM CHLORIDE-SODIUM BICARB 2300-700 MG NA KIT
1.0000 | PACK | Freq: Two times a day (BID) | NASAL | 0 refills | Status: DC
Start: 2018-11-16 — End: 2020-05-22

## 2018-11-16 MED ORDER — FLUTICASONE PROPIONATE 50 MCG/ACT NA SUSP
1.0000 | Freq: Every day | NASAL | 3 refills | Status: DC
Start: 2018-11-16 — End: 2019-04-21

## 2018-11-16 NOTE — Progress Notes (Signed)
   CC: evaluation of runny nose  This is a telephone encounter between [facility] and Melanie Guzman on 11/16/2018 for evaluation of runny nose. The visit was conducted with the patient located at home and Melanie Guzman at [facility]. The patient's identity was confirmed using their DOB and current address. The patient has consented to being evaluated through a telephone encounter and understands the associated risks (an examination cannot be done and the patient may need to come in for an appointment) / benefits (allows the patient to remain at home, decreasing exposure to coronavirus). I personally spent 15 minutes on medical discussion.   HPI:  [name] is a 63 y.o. with PMH as below.   Please see A&P for assessment of the patient's acute and chronic medical conditions.   Past Medical History:  Diagnosis Date  . Back pain   . Chronic bronchitis (HCC)   . Cirrhosis (HCC)   . Hepatitis C   . Hypertension   . Pancreatitis   . Upper airway cough syndrome 01/29/2018  . URI (upper respiratory infection) 04/01/2018   Review of Systems:  Refer to history of present illness and assessment and plans for pertinent review of systems, all others reviewed and negative   Assessment & Plan:   Allergic Rhinitis  Patient has sinus drainage, nausea, watery eyes, sneezing, post nasal drip and cough productive of light green sputum. Symptoms have been ongoing for the past week.She denies headaches and sinus pressure. She denies shortness of breath, fever, vomiting or diarrhea. No history of COPD or asthma but does have chronic bronchitis. She is a current every day smoker. Symptoms are consistent with prior allergic rhinitis which improved with antihistamine and nasal corticosteroids.  - start sinus washes  - refilled flonase  - Ms. Cazeau will call the clinic if symptoms are unimproved in 1 week   See Encounters Tab for problem based charting.  Patient discussed with Dr. Criselda Peaches

## 2018-11-17 ENCOUNTER — Encounter: Payer: Self-pay | Admitting: Internal Medicine

## 2018-11-17 DIAGNOSIS — J309 Allergic rhinitis, unspecified: Secondary | ICD-10-CM | POA: Insufficient documentation

## 2018-11-17 NOTE — Assessment & Plan Note (Signed)
Patient has sinus drainage, nausea, watery eyes, sneezing, post nasal drip and cough productive of light green sputum. Symptoms have been ongoing for the past week.She denies headaches and sinus pressure. She denies shortness of breath, fever, vomiting or diarrhea. No history of COPD or asthma but does have chronic bronchitis. She is a current every day smoker. Symptoms are consistent with prior allergic rhinitis which improved with antihistamine and nasal corticosteroids.  - start sinus washes  - refilled flonase  - Ms. Barwick will call the clinic if symptoms are unimproved in 1 week

## 2018-11-18 ENCOUNTER — Ambulatory Visit: Payer: BC Managed Care – PPO | Admitting: Internal Medicine

## 2018-11-19 ENCOUNTER — Other Ambulatory Visit: Payer: Self-pay | Admitting: Nurse Practitioner

## 2018-11-19 DIAGNOSIS — K7469 Other cirrhosis of liver: Secondary | ICD-10-CM

## 2018-11-19 NOTE — Progress Notes (Signed)
Internal Medicine Clinic Attending  Case discussed with Dr. Blum soon after the resident saw the patient.  We reviewed the resident's history, telephone conversation and pertinent patient test results.  I agree with the assessment, diagnosis, and plan of care documented in the resident's note.   

## 2018-11-24 ENCOUNTER — Other Ambulatory Visit: Payer: Self-pay | Admitting: Internal Medicine

## 2018-11-24 DIAGNOSIS — I10 Essential (primary) hypertension: Secondary | ICD-10-CM

## 2018-11-24 MED ORDER — AMLODIPINE BESYLATE 5 MG PO TABS
5.0000 mg | ORAL_TABLET | Freq: Every day | ORAL | 3 refills | Status: DC
Start: 2018-11-24 — End: 2019-03-22

## 2018-11-24 NOTE — Telephone Encounter (Signed)
Needs refill on    amLODipine (NORVASC) 5 MG tablet   [place], [place] - [address]     ; pt contact [phone]

## 2018-11-24 NOTE — Telephone Encounter (Signed)
Refill pending through [employer]. Have sent another message to PCP and Attending Pool. [name], RN, BSN

## 2018-11-24 NOTE — Telephone Encounter (Signed)
Patient notified refill has been sent. She is very Patent attorney. [name], RN, BSN

## 2018-11-24 NOTE — Telephone Encounter (Signed)
Approved.  

## 2018-11-24 NOTE — Telephone Encounter (Signed)
Patient called in again requesting her refill on amlodipine. [name], RN, BSN

## 2018-12-01 ENCOUNTER — Ambulatory Visit
Admission: RE | Admit: 2018-12-01 | Discharge: 2018-12-01 | Disposition: A | Discharge status: Home / Self Care | Payer: BC Managed Care – PPO | Source: Ambulatory Visit | Attending: Nurse Practitioner | Admitting: Nurse Practitioner

## 2018-12-01 DIAGNOSIS — K7469 Other cirrhosis of liver: Secondary | ICD-10-CM

## 2018-12-07 ENCOUNTER — Other Ambulatory Visit: Payer: Self-pay

## 2018-12-07 ENCOUNTER — Ambulatory Visit (INDEPENDENT_AMBULATORY_CARE_PROVIDER_SITE_OTHER): Payer: BC Managed Care – PPO | Admitting: Internal Medicine

## 2018-12-07 ENCOUNTER — Telehealth: Payer: Self-pay | Admitting: *Deleted

## 2018-12-07 DIAGNOSIS — J069 Acute upper respiratory infection, unspecified: Secondary | ICD-10-CM | POA: Diagnosis not present

## 2018-12-07 DIAGNOSIS — F17210 Nicotine dependence, cigarettes, uncomplicated: Secondary | ICD-10-CM | POA: Diagnosis not present

## 2018-12-07 DIAGNOSIS — J302 Other seasonal allergic rhinitis: Secondary | ICD-10-CM

## 2018-12-07 DIAGNOSIS — J42 Unspecified chronic bronchitis: Secondary | ICD-10-CM | POA: Diagnosis not present

## 2018-12-07 DIAGNOSIS — J309 Allergic rhinitis, unspecified: Secondary | ICD-10-CM

## 2018-12-07 MED ORDER — BENZONATATE 100 MG PO CAPS: 100.0000 mg | ORAL_CAPSULE | Freq: Four times a day (QID) | ORAL | 0 refills | Status: DC | PRN

## 2018-12-07 MED ORDER — GUAIFENESIN 100 MG/5ML PO SOLN: 5.0000 mL | ORAL | 0 refills | Status: DC | PRN

## 2018-12-07 MED ORDER — DESLORATADINE 5 MG PO TABS: 5.0000 mg | ORAL_TABLET | Freq: Every day | ORAL | 2 refills | Status: DC

## 2018-12-07 NOTE — Assessment & Plan Note (Signed)
Ongoing for the past 1 month, symptoms slightly improved with flonase and albuterol. Lungs are CTAB and she is oxygenating and speaking easily on room air. Doubt COVID due to lack of sick contacts, chronicity of symptoms, and no fever. No diagnosed copd or asthma but does have chronic bronchitis and smokes 1 ppd for "a long time." Symptoms most consistent with allergic rhinitis.   - continue flonase and albuterol - R desloratadine since it is on her insurance formulary  - Rx prn Robittusin and tessalon Perles. Advised patient she can also purchase these OTC - Start sinus irrigation  - return in 1 week if symptoms aren't improving, can consider prednisone and azithromycin at that time - consider PFTs once she is over this acute illness

## 2018-12-07 NOTE — Telephone Encounter (Signed)
Pt called and ask about amlodipine being refilled today, she was informed that it was filled 6/10 and she would need to ask at pharm specifically for it to be filled, she was agreeable

## 2018-12-07 NOTE — Progress Notes (Signed)
   CC: URI, allergies   HPI:  [name] is a 63 y.o. F with chronic bronchitis and tobacco abuse presenting for evaluation of 1 month of URI symptoms, runny nose, cough productive of clear green phlegm, sinus headache, chest tightness, sneezing, eyes puffy and watery, nauseous. Noticed new shortness of breath last week while sweeping the floor.   Denies vomiting, diarrhea, fevers, chills. Denies sick contacts. She lives by herself and has been practicing social distancing and wears a mask when out.   She had a telehealth visit 3 weeks ago and started using flonase and an old albuterol inhaler. She feels these things have been helping. It was also recommended to start sinus irrigation but she just now got this equipment and has not yet used it. She was previously prescribed claritin but hasn't been able to afford it.   Past Medical History:  Diagnosis Date  . Back pain   . Chronic bronchitis ([place])   . Cirrhosis ([place])   . Hepatitis C   . Hypertension   . Pancreatitis   . Upper airway cough syndrome 01/29/2018  . URI (upper respiratory infection) 04/01/2018    Physical Exam:  Vitals:   12/07/18 0832  BP: (!) 126/95  Pulse: 73  Temp: 98.9 F (37.2 C)  TempSrc: Oral  SpO2: 96%  Weight: 225 lb 11.2 oz (102.4 kg)  Height: 5' 7.5" (1.715 m)   Gen: well appearing female, NAD HENT: moist MM, oropharynx without exudate, eyes puffy with bilateral clear drainage, clear drainage from bilateral nares  Pulm: able to speak in full sentences without difficulty, normal wob, CTAB with good air movement throughout Cardiac: RRR, no m/r/g Abd: soft, NT, +BS  Assessment & Plan:   See Encounters Tab for problem based charting.  Patient discussed with Dr. Lynnae January

## 2018-12-07 NOTE — Patient Instructions (Addendum)
It was nice seeing you today. Thank you for choosing [facility] for your Primary Care.   Today we talked about:  1) Allergies/Cold  - start doing sinus irrigation 1-2 times a day. Read the instructions below.   - use flonase once a day after you perform sinus irrigation  - Start taking an antihistamine. If the one I sent to your pharmacy is too expensive, you can but over the counter  Loratadine (claritin) or Cetirizine (zyrtec)  - Continue using albuterol inhaler as needed  - I have sent a cough medicine to your pharmacy (benzonatate or tessalon perles)  - Instead of Mucinex, try Robitussin for your chest congestion    FOLLOW-UP INSTRUCTIONS When: 1 month with PCP For:  What to bring:   Please contact the clinic if you have any problems, or need to be seen sooner.   BUFFERED ISOTONIC SALINE NASAL IRRIGATION  The Benefits:  1. When you irrigate, the isotonic saline (salt water) acts as a solvent and washes the mucus crusts and other debris from your nose.  2. This decongests and improves the airflow into your nose. The sinus passages begin to open.  3. Studies have also shown that a salt water and an alkaline (baking soda) irrigation solution improves nasal membrane cell function (mucociliary flow of mucus debris).  The Recipe:  1. Choose a 1-quart glass jar that is thoroughly cleansed.  2. Fill with sterile or distilled water, or you can boil water from the tap.  3. Add 1 to 2 heaping teaspoons of "pickling/canning/sea" salt (NOT table salt as it contains a large number of additives). This salt is available at the grocery store in the food canning section.  4. Add 1 teaspoon of Arm & Hammer Baking Soda (pure bicarbonate).  5. Mix ingredients together and store at room temperature. Discard after one week. If you find this solution too strong, you may decrease the amount of salt added to 1 to 1  teaspoons. With children it is often best to start with a milder  solution and advance slowly. Irrigate with 240 ml (8 oz) twice daily.  The Instructions:  You should plan to irrigate your nose with buffered isotonic saline 2 times per day. Many people prefer to warm the solution slightly in the microwave - but be sure that the solution is NOT HOT. Stand over the sink (some do this in the shower) and squirt the solution into each side of your nose, keeping your mouth open. This allows you to spit the saltwater out of your mouth. It will not harm you if you swallow a little.  If you have been told to use a nasal steroid such as Flonase, Nasonex, or Nasacort, you should always use isortonic saline solution first, then use your nasal steroid product. The nasal steroid is much more effective when sprayed onto clean nasal membranes and the steroid medicine will reach deeper into the nose.  Most people experience a little burning sensation the first few times they use a isotonic saline solution, but this usually goes away within a few days.

## 2018-12-10 NOTE — Progress Notes (Signed)
Internal Medicine Clinic Attending  Case discussed with Dr. Vogel  at the time of the visit.  We reviewed the resident's history and exam and pertinent patient test results.  I agree with the assessment, diagnosis, and plan of care documented in the resident's note.  

## 2019-03-15 ENCOUNTER — Other Ambulatory Visit: Payer: Self-pay

## 2019-03-15 ENCOUNTER — Ambulatory Visit
Admission: RE | Admit: 2019-03-15 | Discharge: 2019-03-15 | Disposition: A | Discharge status: Home / Self Care | Payer: BC Managed Care – PPO | Source: Ambulatory Visit | Attending: Internal Medicine | Admitting: Internal Medicine

## 2019-03-15 DIAGNOSIS — R921 Mammographic calcification found on diagnostic imaging of breast: Secondary | ICD-10-CM

## 2019-03-21 NOTE — Progress Notes (Signed)
   CC: Hypertension follow up  HPI:  Melanie Guzman is a 63 y.o. with htn, hep c with cirrhosis, subclinical hypothyroidism, depression, and tobacco use disorder who presents for hypertension follow up. Please see problem based charting for evaluation, assessment, and plan.  Past Medical History:  Diagnosis Date  . Back pain   . Chronic bronchitis ([place])   . Cirrhosis ([place])   . Hepatitis C   . Hypertension   . Pancreatitis   . Upper airway cough syndrome 01/29/2018  . URI (upper respiratory infection) 04/01/2018   Review of Systems:    Review of Systems  Constitutional: Negative for chills and fever.  Cardiovascular: Negative for chest pain.  Gastrointestinal: Negative for nausea.  Genitourinary: Positive for urgency.  Musculoskeletal: Positive for joint pain.  Psychiatric/Behavioral: Negative for suicidal ideas. The patient is nervous/anxious and has insomnia.    Physical Exam:  Vitals:   03/22/19 1502 03/22/19 1506  BP:  140/84  Pulse:  74  Temp:  98.5 F (36.9 C)  TempSrc:  Oral  SpO2:  98%  Weight: 230 lb 11.2 oz (104.6 kg)   Height: 5\' 7"  (1.702 m)    Physical Exam  HENT:  Head: Normocephalic and atraumatic.  Eyes: Conjunctivae are normal.  Cardiovascular: Normal rate, regular rhythm and normal heart sounds.  Respiratory: Effort normal and breath sounds normal. No respiratory distress. She has no wheezes.  GI: Soft. Bowel sounds are normal. She exhibits no distension. There is no abdominal tenderness.  Musculoskeletal:     Comments: 4/5 muscular strength on left upper extremity in both gross and fine motor strength, 5/5 right upper extremity strength. Left upper extremity with slightly less sensation compared to right upper extremity.     Neurological: Coordination (finger to nose and rapid alternating movements intact) normal.  Straight leg raise positive. Atalgic gait. Difficulty walking heel to heel, on toes and on heels  Psychiatric: She has a normal  mood and affect. Her behavior is normal. Judgment and thought content normal.     Assessment & Plan:   See Encounters Tab for problem based charting.  Patient discussed with Dr. Rebeca Alert

## 2019-03-22 ENCOUNTER — Ambulatory Visit (HOSPITAL_COMMUNITY)
Admission: RE | Admit: 2019-03-22 | Discharge: 2019-03-22 | Disposition: A | Discharge status: Home / Self Care | Payer: BC Managed Care – PPO | Source: Ambulatory Visit | Attending: Internal Medicine | Admitting: Internal Medicine

## 2019-03-22 ENCOUNTER — Ambulatory Visit (INDEPENDENT_AMBULATORY_CARE_PROVIDER_SITE_OTHER): Payer: BC Managed Care – PPO | Admitting: Internal Medicine

## 2019-03-22 ENCOUNTER — Encounter: Payer: Self-pay | Admitting: Internal Medicine

## 2019-03-22 ENCOUNTER — Other Ambulatory Visit: Payer: Self-pay

## 2019-03-22 VITALS — BP 140/84 | HR 74 | Temp 98.5°F | Ht 67.0 in | Wt 230.7 lb

## 2019-03-22 DIAGNOSIS — R3915 Urgency of urination: Secondary | ICD-10-CM

## 2019-03-22 DIAGNOSIS — F32A Depression, unspecified: Secondary | ICD-10-CM

## 2019-03-22 DIAGNOSIS — G4700 Insomnia, unspecified: Secondary | ICD-10-CM

## 2019-03-22 DIAGNOSIS — I10 Essential (primary) hypertension: Secondary | ICD-10-CM | POA: Diagnosis not present

## 2019-03-22 DIAGNOSIS — R079 Chest pain, unspecified: Secondary | ICD-10-CM | POA: Diagnosis present

## 2019-03-22 DIAGNOSIS — M25511 Pain in right shoulder: Secondary | ICD-10-CM

## 2019-03-22 DIAGNOSIS — E785 Hyperlipidemia, unspecified: Secondary | ICD-10-CM

## 2019-03-22 DIAGNOSIS — Z23 Encounter for immunization: Secondary | ICD-10-CM | POA: Diagnosis not present

## 2019-03-22 DIAGNOSIS — G8929 Other chronic pain: Secondary | ICD-10-CM

## 2019-03-22 DIAGNOSIS — F419 Anxiety disorder, unspecified: Secondary | ICD-10-CM

## 2019-03-22 DIAGNOSIS — R2689 Other abnormalities of gait and mobility: Secondary | ICD-10-CM

## 2019-03-22 DIAGNOSIS — R072 Precordial pain: Secondary | ICD-10-CM

## 2019-03-22 DIAGNOSIS — F329 Major depressive disorder, single episode, unspecified: Secondary | ICD-10-CM

## 2019-03-22 DIAGNOSIS — Z720 Tobacco use: Secondary | ICD-10-CM

## 2019-03-22 DIAGNOSIS — E02 Subclinical iodine-deficiency hypothyroidism: Secondary | ICD-10-CM

## 2019-03-22 DIAGNOSIS — Z79899 Other long term (current) drug therapy: Secondary | ICD-10-CM

## 2019-03-22 DIAGNOSIS — M545 Low back pain: Secondary | ICD-10-CM

## 2019-03-22 DIAGNOSIS — M25561 Pain in right knee: Secondary | ICD-10-CM

## 2019-03-22 DIAGNOSIS — Z0000 Encounter for general adult medical examination without abnormal findings: Secondary | ICD-10-CM

## 2019-03-22 DIAGNOSIS — K7460 Unspecified cirrhosis of liver: Secondary | ICD-10-CM

## 2019-03-22 MED ORDER — DICLOFENAC SODIUM 1 % TD GEL: 4.0000 g | Freq: Two times a day (BID) | TRANSDERMAL | 0 refills | Status: DC | PRN

## 2019-03-22 MED ORDER — ATORVASTATIN CALCIUM 10 MG PO TABS: 10.0000 mg | ORAL_TABLET | Freq: Every day | ORAL | 2 refills | Status: DC

## 2019-03-22 MED ORDER — AMLODIPINE BESYLATE 5 MG PO TABS: 5.0000 mg | ORAL_TABLET | Freq: Every day | ORAL | 3 refills | Status: DC

## 2019-03-22 NOTE — Assessment & Plan Note (Signed)
The patient's blood pressure during this visit was 140/84. The patient is currently taking amlodipine 5mg  qd. Her last blood pressure visits are   BP Readings from Last 3 Encounters:  03/22/19 140/84  12/07/18 (!) 126/95  05/19/18 (!) 157/88   Assessment and Plan Continue amlodipine along with lifestyle medication.

## 2019-03-22 NOTE — Assessment & Plan Note (Signed)
Lower back pain for the past few yrs but worsened in the past few onths. 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  Straight leg raise positive on right side. Muscle strength and strength intact bilaterally.  -Will do repeat mri lumbar spine to evaluate for worsening structural changes.

## 2019-03-22 NOTE — Assessment & Plan Note (Signed)
The patient states that she has 2-3 episodes of precordial chest pain each month for the past few months.   The patient has risk factors for cad such as hyperlipidemia, htn, and tobacco use disorder.   -checked ekg which was without ischemic changes

## 2019-03-22 NOTE — Assessment & Plan Note (Signed)
Patient states that she has right knee that is 7/10 intensity, achy in nature, constantly, worsened when she places pressure on it. The pain has been present for years, but patient feels that it is worse over the past few months. She has used cbd oil and knee brace which help somewhat.   Knee xray from 2014 showed moderate degenerative change.   Assessment and plan  Will repeat right knee xray to evaluate for possible worsening degenerative changes. The patient is without morning stiffness. She does not have any inflammatory changes, warmth to touch, swelling.
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
## 2019-03-22 NOTE — Patient Instructions (Signed)
It was a pleasure to see you today [name]. Please make the following changes:  -I have ordered a right knee xray  -I would like for you to get an mri of your cervical and lumbar spine  -you can apply voltaren gel to your right knee  Follow up 4 weeks   If you have any questions or concerns, please call our clinic at [phone] between 9am-5pm and after hours call [phone] and ask for the internal medicine resident on call. If you feel you are having a medical emergency please call 911.   Thank you, we look forward to help you remain healthy!  Melanie Mage, MD Internal Medicine PGY3

## 2019-03-23 LAB — CMP14 + ANION GAP
ALT: 19 IU/L (ref 0–32)
AST: 24 IU/L (ref 0–40)
Albumin/Globulin Ratio: 1.5 (ref 1.2–2.2)
Albumin: 4.5 g/dL (ref 3.8–4.8)
Alkaline Phosphatase: 68 IU/L (ref 39–117)
Anion Gap: 12 mmol/L (ref 10.0–18.0)
BUN/Creatinine Ratio: 11 — ABNORMAL LOW (ref 12–28)
BUN: 9 mg/dL (ref 8–27)
Bilirubin Total: 0.2 mg/dL (ref 0.0–1.2)
CO2: 25 mmol/L (ref 20–29)
Calcium: 10.3 mg/dL (ref 8.7–10.3)
Chloride: 101 mmol/L (ref 96–106)
Creatinine, Ser: 0.84 mg/dL (ref 0.57–1.00)
GFR calc Af Amer: 86 mL/min/{1.73_m2} (ref >59)
GFR calc non Af Amer: 75 mL/min/{1.73_m2} (ref >59)
Globulin, Total: 3 g/dL (ref 1.5–4.5)
Glucose: 149 mg/dL — ABNORMAL HIGH (ref 65–99)
Potassium: 4.1 mmol/L (ref 3.5–5.2)
Sodium: 138 mmol/L (ref 134–144)
Total Protein: 7.5 g/dL (ref 6.0–8.5)

## 2019-03-24 ENCOUNTER — Telehealth: Payer: Self-pay | Admitting: Licensed Clinical Social Worker

## 2019-03-24 NOTE — Telephone Encounter (Signed)
Patient was contacted to establish care. Patient agreed to an in person visit on 10/22 @ 1:00.

## 2019-03-25 ENCOUNTER — Telehealth: Payer: Self-pay | Admitting: *Deleted

## 2019-03-25 NOTE — Telephone Encounter (Signed)
Called pt - no answer; left message on self-identified vm "Patients creatinine, liver enzymes, andelectrolytes are within normal range." per Dr Maricela Bo. And to call back for any questions.

## 2019-03-25 NOTE — Telephone Encounter (Signed)
-----   Message from Lars Mage, MD sent at 03/24/2019  7:06 PM EDT ----- Patients creatinine, liver enzymes, andelectrolytes are within normal range.

## 2019-03-28 NOTE — Progress Notes (Signed)
Internal Medicine Clinic Attending  Case discussed with Dr. Chundi at the time of the visit.  We reviewed the resident's history and exam and pertinent patient test results.  I agree with the assessment, diagnosis, and plan of care documented in the resident's note.  Alexander Raines, M.D., Ph.D.  

## 2019-04-07 ENCOUNTER — Ambulatory Visit (INDEPENDENT_AMBULATORY_CARE_PROVIDER_SITE_OTHER): Payer: BC Managed Care – PPO | Admitting: Licensed Clinical Social Worker

## 2019-04-07 DIAGNOSIS — F32A Depression, unspecified: Secondary | ICD-10-CM

## 2019-04-07 DIAGNOSIS — F329 Major depressive disorder, single episode, unspecified: Secondary | ICD-10-CM

## 2019-04-07 DIAGNOSIS — F419 Anxiety disorder, unspecified: Secondary | ICD-10-CM

## 2019-04-07 NOTE — BH Specialist Note (Signed)
Integrated Behavioral Health Follow Up Visit  MRN: [id number] Name: Melanie Guzman  Number of [place] Clinician visits: 2/6 Session Start time: 1:35  Session End time: 2:05 Total time: 30  Type of Service: Integrated Behavioral Health- Individual Interpretor:No.   SUBJECTIVE: Melanie Guzman is a 63 y.o. female  whom attended the session individually.  Patient was referred by Dr. Maricela Bo for depression and anxiety. Patient reports the following symptoms/concerns: financial issues, anger, risky behaviors, depression, sleep disturbances, fear, anxiety, and housing challenges.  Duration of problem: almost three years; Severity of problem: moderate  OBJECTIVE: Mood: Anxious and Affect: Depressed and Anxious Risk of harm to self or others: No plan to harm self or others  LIFE CONTEXT: Family and Social: Patient is living alone. Patient continues to report that she does not feel safe in her current apartment, and would like to find alternate housing. Patient was encouraged to identify housing resources that could be a better option for her mental and physical health.  School/Work: Patient gained a part-time job since the last session. Patient has been employed there for around two weeks.  Self-Care: Poor sleep hygiene and has increased her cigarette smoking.  Life Changes: Currently filing for SSI.   GOALS ADDRESSED: Patient will: 1.  Reduce symptoms of: agitation, anxiety, compulsions, depression, insomnia, mood instability, stress and risky behaviors.  2.  Increase knowledge and/or ability of: coping skills, healthy habits, self-management skills and stress reduction  3.  Demonstrate ability to: Increase healthy adjustment to current life circumstances, Increase adequate support systems for patient/family, Increase motivation to adhere to plan of care and Decrease self-medicating behaviors  INTERVENTIONS: Interventions utilized:  Motivational Interviewing,  Solution-Focused Strategies, Mindfulness or Psychologist, educational, Behavioral Activation, Brief CBT, Supportive Counseling and Sleep Hygiene Standardized Assessments completed: assessed for SI, HI, and self-harm.  ASSESSMENT: Patient currently experiencing moderate levels of depression and anxiety. Patient reported there have been some mild improvements in her mood since our session in 05/26/18. Patient has gained a job, and improved her financial situation slightly. Patient continues to have fear and anxiety around her housing situation. Patient has significant childhood trauma, that continues to impact her daily life. Patient reported that she has issues falling and staying asleep. Patient reported she believes her sleeping disturbances are due to her fear that something awful might happen to her while asleep in her apartment.   Patient has increased smoking over the past several months. Patient has also began shoplifting. Patient reported she is stealing items of no value, and has money to purchase these items. Patient is unsure why she has started this unhealthy activity, but reported she has a history of shoplifting as a child. Patient did identify triggers for anxiety, and is planning to challenge her desire to steal.   Patient may benefit from counseling.  PLAN: 1. Follow up with behavioral health clinician on : one to two weeks.  Dessie Coma, [facility], [place]

## 2019-04-13 ENCOUNTER — Encounter: Payer: Self-pay | Admitting: Licensed Clinical Social Worker

## 2019-04-21 ENCOUNTER — Ambulatory Visit (INDEPENDENT_AMBULATORY_CARE_PROVIDER_SITE_OTHER): Payer: BC Managed Care – PPO | Admitting: Internal Medicine

## 2019-04-21 ENCOUNTER — Other Ambulatory Visit: Payer: Self-pay

## 2019-04-21 ENCOUNTER — Encounter: Payer: Self-pay | Admitting: Internal Medicine

## 2019-04-21 DIAGNOSIS — J309 Allergic rhinitis, unspecified: Secondary | ICD-10-CM

## 2019-04-21 DIAGNOSIS — J302 Other seasonal allergic rhinitis: Secondary | ICD-10-CM

## 2019-04-21 MED ORDER — FLUTICASONE PROPIONATE 50 MCG/ACT NA SUSP: 1.0000 | Freq: Every day | NASAL | 3 refills | Status: DC

## 2019-04-21 MED ORDER — BENZONATATE 100 MG PO CAPS: 100.0000 mg | ORAL_CAPSULE | Freq: Four times a day (QID) | ORAL | 0 refills | Status: AC | PRN

## 2019-04-21 MED ORDER — CETIRIZINE HCL 5 MG/5ML PO SOLN: 5.0000 mg | Freq: Every day | ORAL | 1 refills | Status: DC

## 2019-04-21 MED ORDER — DESLORATADINE 5 MG PO TABS: 5.0000 mg | ORAL_TABLET | Freq: Every day | ORAL | 2 refills | Status: DC

## 2019-04-21 NOTE — Progress Notes (Signed)
  [facility] Internal Medicine Residency Telephone Encounter Continuity Care Appointment  HPI:   This telephone encounter was created for Melanie Guzman on 04/21/2019 for the following purpose/cc sinus congestion. MelanieNavejas was evaluated via telephone encounter. She was in her usual state of health until about 2 weeks ago when she began to have progressively worsening rhinorrhea, sinus congestion, productive cough w/ clear sputum and itchy eyes. She mentions these symptoms are recurrent and have used Claritin in the past w/o significant benefit. She also has Flonase at home but she did not use it because it had passed expiration date. For this episodes, she used elderberry juice and alka seltzer without significant benefits and states she 'needs something' to help her feel better as she has work Architectural technologist. She denies any fevers, chills, sick contact (works at [employer]), nausea, vomiting, diarrhea. She mentions concern regarding costs of her medication.   Past Medical History:  Past Medical History:  Diagnosis Date  . Back pain   . Chronic bronchitis ([place])   . Cirrhosis ([place])   . Hepatitis C   . Hypertension   . Pancreatitis   . Upper airway cough syndrome 01/29/2018  . URI (upper respiratory infection) 04/01/2018      ROS:  Review of Systems  Constitutional: Negative for chills, fever and malaise/fatigue.  HENT: Positive for congestion. Negative for sore throat.   Eyes: Positive for redness. Negative for blurred vision.  Respiratory: Positive for cough and sputum production. Negative for shortness of breath.   Cardiovascular: Negative for chest pain, palpitations and leg swelling.  Gastrointestinal: Positive for abdominal pain. Negative for constipation, diarrhea, nausea and vomiting.     Assessment / Plan / Recommendations:   Please see A&P under problem oriented charting for assessment of the patient's acute and chronic medical conditions.   As always, pt is advised that if  symptoms worsen or new symptoms arise, they should go to an urgent care facility or to to ER for further evaluation.   Consent and Medical Decision Making:   Patient discussed with Dr. Dareen Piano  This is a telephone encounter between [facility] and Melanie Guzman on 04/21/2019 for sinus congestion. The visit was conducted with the patient located at home and Melanie Guzman at [facility]. The patient's identity was confirmed using their DOB and current address. The patient has consented to being evaluated through a telephone encounter and understands the associated risks (an examination cannot be done and the patient may need to come in for an appointment) / benefits (allows the patient to remain at home, decreasing exposure to coronavirus). I personally spent 11 minutes on medical discussion.

## 2019-04-21 NOTE — Progress Notes (Signed)
Internal Medicine Clinic Attending ° °Case discussed with Dr. Lee at the time of the visit.  We reviewed the resident’s history and exam and pertinent patient test results.  I agree with the assessment, diagnosis, and plan of care documented in the resident’s note.  °

## 2019-04-21 NOTE — Assessment & Plan Note (Signed)
Presents w/ complaints of sinus congestion, itchy eyes, rhinorrhea, cough w/ clear sputum. Ongoing for about 2 weeks. Not improved with ?elderberry juice. Mentions having Flonase at home which she did not use because she believed it may have expired. Denies fevers, chills, sore throat, sick contact, sensory changes. Discussed need to start anti-histamine therapy. Melanie Guzman expresses significant concern regarding co-pay. Discussed sending script for zyrtec vs clarinex and seeing which is cheaper for her based on her insurance. Melanie Guzman expressed understanding.  - Script for zyrtec & clarinex sent w/ msg to pharmacy to dispense whichever one is cheaper - Flonase script renewed - Tessalon perles

## 2019-04-26 ENCOUNTER — Ambulatory Visit: Payer: BC Managed Care – PPO | Admitting: Licensed Clinical Social Worker

## 2019-04-29 ENCOUNTER — Ambulatory Visit (HOSPITAL_COMMUNITY): Admission: RE | Admit: 2019-04-29 | Payer: BC Managed Care – PPO | Source: Ambulatory Visit

## 2019-05-07 ENCOUNTER — Other Ambulatory Visit: Payer: Self-pay

## 2019-05-07 ENCOUNTER — Ambulatory Visit (HOSPITAL_COMMUNITY)
Admission: RE | Admit: 2019-05-07 | Discharge: 2019-05-07 | Disposition: A | Discharge status: Home / Self Care | Payer: BC Managed Care – PPO | Source: Ambulatory Visit | Attending: Internal Medicine | Admitting: Internal Medicine

## 2019-05-07 DIAGNOSIS — G8929 Other chronic pain: Secondary | ICD-10-CM | POA: Diagnosis present

## 2019-05-07 DIAGNOSIS — M5440 Lumbago with sciatica, unspecified side: Secondary | ICD-10-CM | POA: Diagnosis not present

## 2019-05-07 NOTE — Progress Notes (Signed)
   CC: Hypertension follow up  HPI:  [name] is a 63 y.o. 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 Please see problem based charting for evaluation, assessment, and plan.  Past Medical History:  Diagnosis Date  . Back pain   . Chronic bronchitis ([place])   . Cirrhosis ([place])   . Hepatitis C   . Hypertension   . Pancreatitis   . Upper airway cough syndrome 01/29/2018  . URI (upper respiratory infection) 04/01/2018   Review of Systems:    Has headaches, constipation, occasional tremors,  No changes in skin/nail/hair, diarrhea, nausea/vomiting, chest pain  Physical Exam:  Vitals:   05/10/19 1412  BP: 135/81  Pulse: 72  Temp: 99.1 F (37.3 C)  TempSrc: Oral  SpO2: 98%  Weight: 226 lb 3.2 oz (102.6 kg)  Height: 5\' 7"  (1.702 m)   Physical Exam  Constitutional: She appears well-developed and well-nourished. No distress.  HENT:  Head: Normocephalic and atraumatic.  Eyes: Conjunctivae are normal.  Cardiovascular: Normal rate, regular rhythm and normal heart sounds.  Respiratory: Effort normal. No respiratory distress. She has wheezes (diffuse ).  GI: Soft. Bowel sounds are normal. She exhibits no distension. There is no abdominal tenderness.  Musculoskeletal:        General: Tenderness (lumbar spine to palpation ) present. No edema.     Comments: Straight leg raise positive   Neurological: She is alert.  Skin: She is not diaphoretic.  Psychiatric: She has a normal mood and affect. Her behavior is normal. Judgment and thought content normal.    Assessment & Plan:   See Encounters Tab for problem based charting.  Patient discussed with Dr. Rebeca Alert

## 2019-05-10 ENCOUNTER — Other Ambulatory Visit: Payer: Self-pay

## 2019-05-10 ENCOUNTER — Ambulatory Visit (INDEPENDENT_AMBULATORY_CARE_PROVIDER_SITE_OTHER): Payer: BC Managed Care – PPO | Admitting: Internal Medicine

## 2019-05-10 VITALS — BP 135/81 | HR 72 | Temp 99.1°F | Ht 67.0 in | Wt 226.2 lb

## 2019-05-10 DIAGNOSIS — J309 Allergic rhinitis, unspecified: Secondary | ICD-10-CM

## 2019-05-10 DIAGNOSIS — J302 Other seasonal allergic rhinitis: Secondary | ICD-10-CM

## 2019-05-10 DIAGNOSIS — R7989 Other specified abnormal findings of blood chemistry: Secondary | ICD-10-CM | POA: Diagnosis not present

## 2019-05-10 DIAGNOSIS — Z8619 Personal history of other infectious and parasitic diseases: Secondary | ICD-10-CM

## 2019-05-10 DIAGNOSIS — I10 Essential (primary) hypertension: Secondary | ICD-10-CM | POA: Diagnosis not present

## 2019-05-10 DIAGNOSIS — K7460 Unspecified cirrhosis of liver: Secondary | ICD-10-CM

## 2019-05-10 DIAGNOSIS — G8929 Other chronic pain: Secondary | ICD-10-CM

## 2019-05-10 DIAGNOSIS — Z79899 Other long term (current) drug therapy: Secondary | ICD-10-CM

## 2019-05-10 DIAGNOSIS — M47816 Spondylosis without myelopathy or radiculopathy, lumbar region: Secondary | ICD-10-CM | POA: Diagnosis not present

## 2019-05-10 DIAGNOSIS — K5900 Constipation, unspecified: Secondary | ICD-10-CM

## 2019-05-10 DIAGNOSIS — R251 Tremor, unspecified: Secondary | ICD-10-CM

## 2019-05-10 DIAGNOSIS — E785 Hyperlipidemia, unspecified: Secondary | ICD-10-CM

## 2019-05-10 DIAGNOSIS — R519 Headache, unspecified: Secondary | ICD-10-CM

## 2019-05-10 DIAGNOSIS — F17210 Nicotine dependence, cigarettes, uncomplicated: Secondary | ICD-10-CM

## 2019-05-10 DIAGNOSIS — M48061 Spinal stenosis, lumbar region without neurogenic claudication: Secondary | ICD-10-CM | POA: Diagnosis not present

## 2019-05-10 DIAGNOSIS — Z720 Tobacco use: Secondary | ICD-10-CM

## 2019-05-10 DIAGNOSIS — M5440 Lumbago with sciatica, unspecified side: Secondary | ICD-10-CM

## 2019-05-10 MED ORDER — CELECOXIB 100 MG PO CAPS: 100.0000 mg | ORAL_CAPSULE | Freq: Two times a day (BID) | ORAL | 0 refills | Status: AC

## 2019-05-10 MED ORDER — CELECOXIB 100 MG PO CAPS: 100.0000 mg | ORAL_CAPSULE | Freq: Two times a day (BID) | ORAL | 2 refills | Status: DC

## 2019-05-10 NOTE — Patient Instructions (Addendum)
It was a pleasure to see you today Melanie Guzman. Please make the following changes:  For your back pain: -please start taking celecoxib twice daily as needed -please get physical therapy   For your elevated tsh level:  I have ordered repeat bloodwork   For your wheezing and difficulty breathing: I have ordered pulmonary function tests  For your tobacco use:  Please work on quitting   If you have any questions or concerns, please call our clinic at [phone] between 9am-5pm and after hours call [phone] and ask for the internal medicine resident on call. If you feel you are having a medical emergency please call 911.   Thank you, we look forward to help you remain healthy!  Lars Mage, MD Internal Medicine PGY3    Acute Back Pain, Adult Acute back pain is sudden and usually short-lived. It is often caused by an injury to the muscles and tissues in the back. The injury may result from:  A muscle or ligament getting overstretched or torn (strained). Ligaments are tissues that connect bones to each other. Lifting something improperly can cause a back strain.  Wear and tear (degeneration) of the spinal disks. Spinal disks are circular tissue that provides cushioning between the bones of the spine (vertebrae).  Twisting motions, such as while playing sports or doing yard work.  A hit to the back.  Arthritis. You may have a physical exam, lab tests, and imaging tests to find the cause of your pain. Acute back pain usually goes away with rest and home care. Follow these instructions at home: Managing pain, stiffness, and swelling  Take over-the-counter and prescription medicines only as told by your health care provider.  Your health care provider may recommend applying ice during the first 24-48 hours after your pain starts. To do this: ? Put ice in a plastic bag. ? Place a towel between your skin and the bag. ? Leave the ice on for 20 minutes, 2-3 times a day.  If  directed, apply heat to the affected area as often as told by your health care provider. Use the heat source that your health care provider recommends, such as a moist heat pack or a heating pad. ? Place a towel between your skin and the heat source. ? Leave the heat on for 20-30 minutes. ? Remove the heat if your skin turns bright red. This is especially important if you are unable to feel pain, heat, or cold. You have a greater risk of getting burned. Activity   Do not stay in bed. Staying in bed for more than 1-2 days can delay your recovery.  Sit up and stand up straight. Avoid leaning forward when you sit, or hunching over when you stand. ? If you work at a desk, sit close to it so you do not need to lean over. Keep your chin tucked in. Keep your neck drawn back, and keep your elbows bent at a right angle. Your arms should look like the letter "L." ? Sit high and close to the steering wheel when you drive. Add lower back (lumbar) support to your car seat, if needed.  Take short walks on even surfaces as soon as you are able. Try to increase the length of time you walk each day.  Do not sit, drive, or stand in one place for more than 30 minutes at a time. Sitting or standing for long periods of time can put stress on your back.  Do not drive or use  heavy machinery while taking prescription pain medicine.  Use proper lifting techniques. When you bend and lift, use positions that put less stress on your back: ? [place] your knees. ? Keep the load close to your body. ? Avoid twisting.  Exercise regularly as told by your health care provider. Exercising helps your back heal faster and helps prevent back injuries by keeping muscles strong and flexible.  Work with a physical therapist to make a safe exercise program, as recommended by your health care provider. Do any exercises as told by your physical therapist. Lifestyle  Maintain a healthy weight. Extra weight puts stress on your back and  makes it difficult to have good posture.  Avoid activities or situations that make you feel anxious or stressed. Stress and anxiety increase muscle tension and can make back pain worse. Learn ways to manage anxiety and stress, such as through exercise. General instructions  Sleep on a firm mattress in a comfortable position. Try lying on your side with your knees slightly bent. If you lie on your back, put a pillow under your knees.  Follow your treatment plan as told by your health care provider. This may include: ? Cognitive or behavioral therapy. ? Acupuncture or massage therapy. ? Meditation or yoga. Contact a health care provider if:  You have pain that is not relieved with rest or medicine.  You have increasing pain going down into your legs or buttocks.  Your pain does not improve after 2 weeks.  You have pain at night.  You lose weight without trying.  You have a fever or chills. Get help right away if:  You develop new bowel or bladder control problems.  You have unusual weakness or numbness in your arms or legs.  You develop nausea or vomiting.  You develop abdominal pain.  You feel faint. Summary  Acute back pain is sudden and usually short-lived.  Use proper lifting techniques. When you bend and lift, use positions that put less stress on your back.  Take over-the-counter and prescription medicines and apply heat or ice as directed by your health care provider. This information is not intended to replace advice given to you by your health care provider. Make sure you discuss any questions you have with your health care provider. Document Released: 06/02/2005 Document Revised: 09/21/2018 Document Reviewed: 01/14/2017 Elsevier Patient Education  2020 [employer].

## 2019-05-11 DIAGNOSIS — R7989 Other specified abnormal findings of blood chemistry: Secondary | ICD-10-CM | POA: Insufficient documentation

## 2019-05-11 LAB — TSH: TSH: 0.497 u[IU]/mL (ref 0.450–4.500)

## 2019-05-11 LAB — T4, FREE: Free T4: 1.37 ng/dL (ref 0.82–1.77)

## 2019-05-11 NOTE — Assessment & Plan Note (Signed)
Patient states that she has been smoking 1/2-1ppd every day. She states that she is ready to stop smoking. She is motivated by her significant other to stop smoking. She does not want to try chantix due to suicide risk.   -patient will use bupropion

## 2019-05-11 NOTE — Assessment & Plan Note (Signed)
The patient's blood pressure during this visit was 135/81. The patient is currently taking amlodipine 5mg  qd. She has lost 4lbs since her last visit. Her last blood pressure visits are   BP Readings from Last 3 Encounters:  05/10/19 135/81  03/22/19 140/84  12/07/18 (!) 126/95   Assessment and Plan At goal, continue current regimen.

## 2019-05-11 NOTE — Assessment & Plan Note (Signed)
Patient's last tsh 0.327 and free t4 1.38 in September 2019.   Will recheck tsh and free t4

## 2019-05-11 NOTE — Assessment & Plan Note (Signed)
Patient states that she has been having lower back pain that is 9/10 intensity, intermittent, worsened with movement/bending/sitting down.   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   -prescribed celebrex 100mg  bid -refer to physical therapy

## 2019-05-11 NOTE — Assessment & Plan Note (Signed)
Patient states that she has been having 6 week history of headaches, productive cough, sinus pressure, eyes runny, postnasal drip, and nasal congestion. Was diagnosed with allergic rhinitis on 04/21/19. She was given zyrtec/claritin, flonase, and tessalon pearls.   The patient states that she has found some relief, but continues to have some symptoms along with wheezing and dyspnea.   Assessment and plan  Recommended the patient to do nasal sinus rinses and warm mist. The patient smokes and she states that she was diagnose with chronic bronchitis previously. Will plan to do pulmonary function tests to evaluate for copd vs asthma.   -nasal sinus rinses and warm mists -ordered pulmonary function tests

## 2019-05-17 NOTE — Progress Notes (Signed)
Internal Medicine Clinic Attending  Case discussed with Dr. Chundi at the time of the visit.  We reviewed the resident's history and exam and pertinent patient test results.  I agree with the assessment, diagnosis, and plan of care documented in the resident's note.  Alexander Raines, M.D., Ph.D.  

## 2019-05-25 ENCOUNTER — Ambulatory Visit: Payer: BC Managed Care – PPO | Attending: Internal Medicine | Admitting: Physical Therapy

## 2019-05-28 ENCOUNTER — Emergency Department (HOSPITAL_COMMUNITY)
Admission: EM | Admit: 2019-05-28 | Discharge: 2019-05-28 | Disposition: A | Discharge status: Home / Self Care | Payer: BC Managed Care – PPO | Attending: Emergency Medicine | Admitting: Emergency Medicine

## 2019-05-28 ENCOUNTER — Encounter (HOSPITAL_COMMUNITY): Payer: Self-pay | Admitting: Emergency Medicine

## 2019-05-28 ENCOUNTER — Other Ambulatory Visit: Payer: Self-pay

## 2019-05-28 DIAGNOSIS — F17210 Nicotine dependence, cigarettes, uncomplicated: Secondary | ICD-10-CM | POA: Diagnosis not present

## 2019-05-28 DIAGNOSIS — R0981 Nasal congestion: Secondary | ICD-10-CM | POA: Diagnosis not present

## 2019-05-28 DIAGNOSIS — I10 Essential (primary) hypertension: Secondary | ICD-10-CM | POA: Insufficient documentation

## 2019-05-28 DIAGNOSIS — R110 Nausea: Secondary | ICD-10-CM

## 2019-05-28 DIAGNOSIS — E785 Hyperlipidemia, unspecified: Secondary | ICD-10-CM | POA: Diagnosis not present

## 2019-05-28 DIAGNOSIS — Z79899 Other long term (current) drug therapy: Secondary | ICD-10-CM | POA: Diagnosis not present

## 2019-05-28 DIAGNOSIS — R1013 Epigastric pain: Secondary | ICD-10-CM | POA: Diagnosis present

## 2019-05-28 DIAGNOSIS — R112 Nausea with vomiting, unspecified: Secondary | ICD-10-CM | POA: Insufficient documentation

## 2019-05-28 LAB — CBC
HCT: 45.8 % (ref 36.0–46.0)
Hemoglobin: 14.9 g/dL (ref 12.0–15.0)
MCH: 29.3 pg (ref 26.0–34.0)
MCHC: 32.5 g/dL (ref 30.0–36.0)
MCV: 90 fL (ref 80.0–100.0)
Platelets: 191 10*3/uL (ref 150–400)
RBC: 5.09 MIL/uL (ref 3.87–5.11)
RDW: 13.5 % (ref 11.5–15.5)
WBC: 7.5 10*3/uL (ref 4.0–10.5)
nRBC: 0 % (ref 0.0–0.2)

## 2019-05-28 LAB — URINALYSIS, ROUTINE W REFLEX MICROSCOPIC
Bilirubin Urine: NEGATIVE
Glucose, UA: NEGATIVE mg/dL
Hgb urine dipstick: NEGATIVE
Ketones, ur: NEGATIVE mg/dL
Leukocytes,Ua: NEGATIVE
Nitrite: NEGATIVE
Protein, ur: NEGATIVE mg/dL
Specific Gravity, Urine: 1.006 (ref 1.005–1.030)
pH: 5 (ref 5.0–8.0)

## 2019-05-28 LAB — COMPREHENSIVE METABOLIC PANEL
ALT: 23 U/L (ref 0–44)
AST: 24 U/L (ref 15–41)
Albumin: 4.2 g/dL (ref 3.5–5.0)
Alkaline Phosphatase: 61 U/L (ref 38–126)
Anion gap: 9 (ref 5–15)
BUN: 7 mg/dL — ABNORMAL LOW (ref 8–23)
CO2: 30 mmol/L (ref 22–32)
Calcium: 10.3 mg/dL (ref 8.9–10.3)
Chloride: 102 mmol/L (ref 98–111)
Creatinine, Ser: 0.74 mg/dL (ref 0.44–1.00)
GFR calc Af Amer: >60 mL/min (ref >60)
GFR calc non Af Amer: >60 mL/min (ref >60)
Glucose, Bld: 122 mg/dL — ABNORMAL HIGH (ref 70–99)
Potassium: 4.1 mmol/L (ref 3.5–5.1)
Sodium: 141 mmol/L (ref 135–145)
Total Bilirubin: 0.4 mg/dL (ref 0.3–1.2)
Total Protein: 7.9 g/dL (ref 6.5–8.1)

## 2019-05-28 LAB — LIPASE, BLOOD: Lipase: 24 U/L (ref 11–51)

## 2019-05-28 MED ORDER — SODIUM CHLORIDE 0.9% FLUSH
3.0000 mL | Freq: Once | INTRAVENOUS | Status: AC
  Administered 2019-05-28: 3 mL via INTRAVENOUS

## 2019-05-28 MED ORDER — ONDANSETRON HCL 4 MG/2ML IJ SOLN
4.0000 mg | Freq: Once | INTRAMUSCULAR | Status: AC
  Administered 2019-05-28: 08:00:00 4 mg via INTRAVENOUS
  Filled 2019-05-28: qty 2

## 2019-05-28 MED ORDER — BENZONATATE 100 MG PO CAPS
100.0000 mg | ORAL_CAPSULE | Freq: Once | ORAL | Status: AC
  Administered 2019-05-28: 08:00:00 100 mg via ORAL
  Filled 2019-05-28: qty 1

## 2019-05-28 NOTE — Discharge Instructions (Signed)
Your labs are normal today, your EKG is normal.  Call and follow up with your doctor for further management of  your congestions.

## 2019-05-28 NOTE — ED Triage Notes (Signed)
Patient complaining her abdomen hurts and she feels sick to her stomach. Patient states that she has had some diarrhea. Patient has not taken blood pressure medication in two weeks. Patient also has had a sinus infection for about a month. Patient is currently taking medication for it.

## 2019-05-28 NOTE — ED Provider Notes (Signed)
[facility] DEPT Provider Note   CSN: [id number] Arrival date & time: 05/28/19  6440     History Chief Complaint  Patient presents with  . Abdominal Pain    Melanie Guzman is a 63 y.o. female.  The history is provided by the patient and medical records. No language interpreter was used.  Abdominal Pain      63 year old female with prior history of pancreatitis, hepatitis C, cirrhosis, presenting for evaluation of abdominal pain.  Patient reports she has had sinus congestion, allergy symptoms ongoing for more than a month.  Endorse that as headache, sneezing, postnasal drainage that has been persistent.  She has been seen and evaluated by her PCP for that and has been receiving medication to help her with her symptoms.  She is here due to having bouts of nausea for the past 2 to 3 days without any vomiting and also having some mild loose stools.  She endorsed some epigastric discomfort as well.  She attributed to postnasal drip causing her to feel nauseous, as well as having recurrent cough followed by bouts of nausea.  She does not complain of any shortness of breath, does not complain of any loss of taste or smell fever chills or dysuria.  She has not been evaluated for COVID-19 and does not want any additional testing for that.  Her primary concern is her nausea.  She mention her abdominal discomfort is mild.  Past Medical History:  Diagnosis Date  . Back pain   . Chronic bronchitis ([place])   . Cirrhosis ([place])   . Hepatitis C   . Hypertension   . Pancreatitis   . Upper airway cough syndrome 01/29/2018  . URI (upper respiratory infection) 04/01/2018    Patient Active Problem List   Diagnosis Date Noted  . Low serum thyroid stimulating hormone (TSH) 05/11/2019  . Right knee pain 03/22/2019  . Chest pain 03/22/2019  . Allergic rhinitis 11/17/2018  . Viral URI with cough 04/01/2018  . Subclinical hypothyroidism 03/11/2018  . Right shoulder pain  03/05/2018  . Vaginal itching 01/29/2018  . Hypertension 01/29/2018  . Hyperlipidemia 12/03/2017  . 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 Bronchitis 12/01/2017  . Healthcare maintenance 11/30/2012  . Back pain 11/30/2012  . Depression 11/30/2012  . RUQ abdominal pain 11/30/2012  . Tobacco abuse 11/30/2012    Past Surgical History:  Procedure Laterality Date  . ABDOMINAL HYSTERECTOMY     partial     OB History    Gravida  2   Para  2   Term  2   Preterm      AB      Living  2     SAB      TAB      Ectopic      Multiple      Live Births              Family History  Problem Relation Age of Onset  . Heart attack Mother   . Stroke Maternal Grandmother   . Bone cancer Maternal Grandfather   . Cancer Maternal Grandfather   . Pancreatic cancer Paternal Grandmother   . Colon cancer Neg Hx     Social History   Tobacco Use  . Smoking status: Current Every Day Smoker    Packs/day: 1.00    Years: 40.00    Pack years: 40.00    Types: Cigarettes  . Smokeless tobacco: Never Used  .  Tobacco comment: Sometimes less.  Substance Use Topics  . Alcohol use: No  . Drug use: No    Home Medications Prior to Admission medications   Medication Sig Start Date End Date Taking? Authorizing Provider  amLODipine (NORVASC) 5 MG tablet Take 1 tablet (5 mg total) by mouth daily. 03/22/19   Chundi, Verne Spurr, MD  atorvastatin (LIPITOR) 10 MG tablet Take 1 tablet (10 mg total) by mouth daily. 03/22/19 03/21/20  Chundi, Verne Spurr, MD  benzonatate (TESSALON PERLES) 100 MG capsule Take 1 capsule (100 mg total) by mouth every 6 (six) hours as needed for cough. 04/21/19 04/20/20  Mosetta Anis, MD  celecoxib (CELEBREX) 100 MG capsule Take 1 capsule (100 mg total) by mouth 2 (two) times daily. 05/10/19 06/09/19  Lars Mage, MD  cetirizine HCl (ZYRTEC) 5 MG/5ML SOLN Take 5 mLs (5 mg total) by mouth daily. 04/21/19   Mosetta Anis, MD  desloratadine  (CLARINEX) 5 MG tablet Take 1 tablet (5 mg total) by mouth daily. 04/21/19 04/20/20  Mosetta Anis, MD  diclofenac sodium (VOLTAREN) 1 % GEL Apply 4 g topically 2 (two) times daily as needed. 03/22/19   Chundi, Verne Spurr, MD  fluticasone (FLONASE) 50 MCG/ACT nasal spray Place 1 spray into both nostrils daily. 04/21/19 05/21/19  Mosetta Anis, MD  sodium chloride (OCEAN) 0.65 % SOLN nasal spray Place 1 spray into both nostrils as needed for congestion. 01/29/18 02/28/18  Chundi, Verne Spurr, MD  Sodium Chloride-Sodium Bicarb (CLASSIC NETI POT SINUS WASH) 2300-700 MG KIT Place 1 packet into the nose 2 (two) times daily. 11/16/18   Ledell Noss, MD    Allergies    Patient has no known allergies.  Review of Systems   Review of Systems  Gastrointestinal: Positive for abdominal pain.  All other systems reviewed and are negative.   Physical Exam Updated Vital Signs BP (!) 169/108 (BP Location: Right Arm)   Pulse 92   Temp 98.2 F (36.8 C) (Oral)   Resp 18   Ht _0  (1.727 m)   Wt 102.1 kg   SpO2 100%   BMI 34.21 kg/m   Physical Exam Vitals and nursing note reviewed.  Constitutional:      General: She is not in acute distress.    Appearance: She is well-developed.  HENT:     Head: Atraumatic.  Eyes:     Conjunctiva/sclera: Conjunctivae normal.  Cardiovascular:     Rate and Rhythm: Normal rate and regular rhythm.  Pulmonary:     Effort: Pulmonary effort is normal.     Breath sounds: Normal breath sounds.  Abdominal:     General: Abdomen is flat.     Palpations: Abdomen is soft.     Tenderness: There is no abdominal tenderness (No significant abdominal tenderness on exam.  Negative Murphy sign, no pain at McBurney's point.).  Musculoskeletal:     Cervical back: Neck supple.  Skin:    Findings: No rash.  Neurological:     Mental Status: She is alert. Mental status is at baseline.  Psychiatric:        Mood and Affect: Mood normal.     ED Results / Procedures / Treatments   Labs (all  labs ordered are listed, but only abnormal results are displayed) Labs Reviewed  COMPREHENSIVE METABOLIC PANEL - Abnormal; Notable for the following components:      Result Value   Glucose, Bld 122 (*)    BUN 7 (*)    All other components within normal  limits  LIPASE, BLOOD  CBC  URINALYSIS, ROUTINE W REFLEX MICROSCOPIC    EKG None   Date: 05/28/2019  Rate: 64  Rhythm: normal sinus rhythm  QRS Axis: normal  Intervals: normal  ST/T Wave abnormalities: normal  Conduction Disutrbances: none  Narrative Interpretation:   Old EKG Reviewed: No significant changes noted     Radiology No results found.  Procedures Procedures (including critical care time)  Medications Ordered in ED Medications  sodium chloride flush (NS) 0.9 % injection 3 mL (has no administration in time range)    ED Course  I have reviewed the triage vital signs and the nursing notes.  Pertinent labs & imaging results that were available during my care of the patient were reviewed by me and considered in my medical decision making (see chart for details).    MDM Rules/Calculators/A&P     CHA2DS2/VAS Stroke Risk Points      N/A >= 2 Points: High Risk  1 - 1.99 Points: Medium Risk  0 Points: Low Risk    A final score could not be computed because of missing components.: Last  Change: N/A     This score determines the patient's risk of having a stroke if the  patient has atrial fibrillation.      This score is not applicable to this patient. Components are not  calculated.                   BP (!) 169/108 (BP Location: Right Arm)   Pulse 92   Temp 98.2 F (36.8 C) (Oral)   Resp 18   Ht _0  (1.727 m)   Wt 102.1 kg   SpO2 100%   BMI 34.21 kg/m   Final Clinical Impression(s) / ED Diagnoses Final diagnoses:  Nausea  Sinus congestion    Rx / DC Orders ED Discharge Orders    None     6:57 AM Patient endorsed nausea and upper abdominal discomfort from postnasal drip secondary to her  current sinus allergy.  She is well appearing, with a soft nontender abdomen on exam.  Lungs clear.  Patient does not want to be tested for COVID-19.  Will perform screening labs, provide symptomatic treatment, anticipate discharge.  EKG unremarkable.   Domenic Moras, PA-C 05/28/19 8811    Orpah Greek, MD 06/02/19 [id number]

## 2019-06-08 ENCOUNTER — Ambulatory Visit: Payer: BC Managed Care – PPO | Admitting: Physical Therapy

## 2019-06-14 ENCOUNTER — Ambulatory Visit: Payer: BC Managed Care – PPO | Admitting: Physical Therapy

## 2019-06-21 ENCOUNTER — Other Ambulatory Visit: Payer: Self-pay | Admitting: Nurse Practitioner

## 2019-06-22 ENCOUNTER — Other Ambulatory Visit: Payer: Self-pay | Admitting: Nurse Practitioner

## 2019-06-22 DIAGNOSIS — K7469 Other cirrhosis of liver: Secondary | ICD-10-CM

## 2019-07-01 ENCOUNTER — Ambulatory Visit: Payer: BC Managed Care – PPO | Attending: Internal Medicine | Admitting: Physical Therapy

## 2019-07-01 NOTE — Addendum Note (Signed)
Addended by: Neomia Dear on: 07/01/2019 06:21 PM   Modules accepted: Orders

## 2019-07-05 ENCOUNTER — Other Ambulatory Visit: Payer: BC Managed Care – PPO

## 2019-09-02 ENCOUNTER — Other Ambulatory Visit: Payer: BC Managed Care – PPO

## 2019-09-08 ENCOUNTER — Other Ambulatory Visit: Payer: BC Managed Care – PPO

## 2019-09-21 ENCOUNTER — Ambulatory Visit
Admission: RE | Admit: 2019-09-21 | Discharge: 2019-09-21 | Disposition: A | Discharge status: Home / Self Care | Payer: BC Managed Care – PPO | Source: Ambulatory Visit | Attending: Nurse Practitioner | Admitting: Nurse Practitioner

## 2019-09-21 DIAGNOSIS — K7469 Other cirrhosis of liver: Secondary | ICD-10-CM

## 2019-12-02 ENCOUNTER — Encounter: Payer: Self-pay | Admitting: *Deleted

## 2019-12-08 ENCOUNTER — Ambulatory Visit: Payer: BC Managed Care – PPO | Attending: Internal Medicine

## 2019-12-08 DIAGNOSIS — Z23 Encounter for immunization: Secondary | ICD-10-CM

## 2019-12-08 NOTE — Progress Notes (Signed)
   Covid-19 Vaccination Clinic  Name:  Nehal Witting    MRN: [id number] DOB: 11-25-1955  12/08/2019  Ms. Theiler was observed post Covid-19 immunization for 15 minutes without incident. She was provided with Vaccine Information Sheet and instruction to access the V-Safe system.   Ms. Both was instructed to call 911 with any severe reactions post vaccine: Marland Kitchen Difficulty breathing  . Swelling of face and throat  . A fast heartbeat  . A bad rash all over body  . Dizziness and weakness   Immunizations Administered    Name Date Dose VIS Date Route   Pfizer COVID-19 Vaccine 12/08/2019  8:53 AM 0.3 mL 08/10/2018 Intramuscular   Manufacturer: [employer], [employer]   Lot: [id number]   NDC: [address]-3

## 2020-01-17 ENCOUNTER — Other Ambulatory Visit: Payer: Self-pay | Admitting: Nurse Practitioner

## 2020-01-17 DIAGNOSIS — K7469 Other cirrhosis of liver: Secondary | ICD-10-CM

## 2020-02-08 ENCOUNTER — Ambulatory Visit
Admission: RE | Admit: 2020-02-08 | Discharge: 2020-02-08 | Disposition: A | Discharge status: Home / Self Care | Payer: 59 | Source: Ambulatory Visit | Attending: Nurse Practitioner | Admitting: Nurse Practitioner

## 2020-02-08 DIAGNOSIS — K7469 Other cirrhosis of liver: Secondary | ICD-10-CM

## 2020-05-21 NOTE — Assessment & Plan Note (Deleted)
Follows with hepatology at [facility] MELD 6 HCV treated with 12w of Harvoni with sustained virologic response and cure. Last screening US 09/2019 neg for lesions. Needs screening US every 89mo Pt declines endoscopy

## 2020-05-21 NOTE — Assessment & Plan Note (Addendum)
Current medications: unknown Blood pressure is elevated in the clinic today.  Plan: will obtain records (meds, labs) from [facility] prior to changing any medications. Message sent to our clinical pharmacist to call pt tomorrow to review her current medications. She can follow up with the pharmacist for a blood pressure recheck to guide further management.

## 2020-05-21 NOTE — Assessment & Plan Note (Addendum)
Will need med rec to see what was prescribed at [facility]. Awaiting labs from their facility as pt has financial barriers to obtaining labs at this time.

## 2020-05-21 NOTE — Progress Notes (Signed)
Office Visit   Patient ID: Melanie Guzman, female    DOB: 08/27/1955, 64 y.o.   MRN: [id number]  Subjective:  CC: hypertension, cataracts  HPI 64 y.o. presents today for follow up of chronic hypertension and for assistance with resources for her to see ophthalmology for cataracts.  She notes that she has followed with our clinic in the past however was recently seen at [facility] primary care office in October as she was hoping they could connect her with resources to get her cataract removed. Upon evaluation at their office, she was started on some new blood pressure medication however she is unsure of the names or doses. Due to financial constraints, she was unable to afford to pick the prescriptions up until yesterday. Oddly, she also notes that her SBP was 127 at that visit. She also had labs drawn at that time.  The majority of this encounter was spent discussing the process of obtaining a referral to ophthalmology for a cataract in her right eye. She notes that she had insurance through her husband's work place. Unfortunately, the job required him to move and he did not elect to, so she has now been uninsured for a few months. Prior to losing insurance, she was seen by ophthalmology who recommended surgical intervention for the cataract. Unfortunately, she was unable to afford the co-pay at that time.  The vision loss due to the cataract has continued to be bothersome to her, making reading and normal functioning difficult.      ACTIVE MEDICATIONS   Current Outpatient Medications on File Prior to Visit  Medication Sig Dispense Refill  . amLODipine (NORVASC) 5 MG tablet Take 1 tablet (5 mg total) by mouth daily. 90 tablet 3  . atorvastatin (LIPITOR) 10 MG tablet Take 1 tablet (10 mg total) by mouth daily. 90 tablet 2   No current facility-administered medications on file prior to visit.    ROS  Review of Systems  Eyes: Positive for visual disturbance.  Respiratory: Negative for  cough and shortness of breath.   Cardiovascular: Negative for chest pain and leg swelling.  Neurological: Negative for dizziness, syncope, light-headedness and headaches.    Objective:   BP (!) 150/91 (BP Location: Left Arm, Cuff Size: Normal)   Pulse 69   Temp 98.4 F (36.9 C) (Oral)   Ht 5\' 8"  (1.727 m)   Wt 215 lb 12.8 oz (97.9 kg)   SpO2 99% Comment: room air  BMI 32.81 kg/m  Wt Readings from Last 3 Encounters:  05/22/20 215 lb 12.8 oz (97.9 kg)  05/28/19 225 lb (102.1 kg)  05/10/19 226 lb 3.2 oz (102.6 kg)   BP Readings from Last 3 Encounters:  05/22/20 (!) 150/91  05/28/19 (!) 144/89  05/10/19 135/81   Physical Exam Constitutional:      Appearance: Normal appearance.  Cardiovascular:     Rate and Rhythm: Normal rate and regular rhythm.  Pulmonary:     Effort: Pulmonary effort is normal.     Breath sounds: Normal breath sounds.  Musculoskeletal:     Right lower leg: No edema.     Left lower leg: No edema.     Health Maintenance:   Health Maintenance  Topic Date Due  . HIV Screening  Never done  . TETANUS/TDAP  Never done  . COVID-19 Vaccine (2 - Pfizer 2-dose series) 12/29/2019  . INFLUENZA VACCINE  01/15/2020  . MAMMOGRAM  03/14/2021  . COLONOSCOPY  03/02/2024  . Hepatitis C Screening  Completed  Assessment & Plan:   Problem List Items Addressed This Visit      Cardiovascular and Mediastinum   Hypertension (Chronic)    Current medications: unknown Blood pressure is elevated in the clinic today.  Plan: will obtain records (meds, labs) from [facility] prior to changing any medications. Message sent to our clinical pharmacist to call pt tomorrow to review her current medications. She can follow up with the pharmacist for a blood pressure recheck to guide further management.        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 She is due for [facility] screening however this is unable to be  obtained due to financial barriers. Will have her follow up after orange card is obtained at which time I recommend ordering a screening RUQ [place].        Other   Healthcare maintenance (Chronic)    Breast cancer screening. Deferred at this time per pt request due to financial barriers. Upon obtaining coverage, will need Diagnostic mammography with right breast magnification views   Vaccinations: deferred due to financial barriers.      Hyperlipidemia (Chronic)    Current medications: Lipitor 10 mg We will check a lipid panel today      Change in financial circumstances    Unfortunately, her uninsured status and lack of financial means for out of pocket payment is precluding primary and secondary preventative measures as well as treatment for a right eye cataract. She is unable to readily afford medications at this time.  I confirmed she has an appointment scheduled with Ardis Rowan [facility] financial counselor, on 12/14, at which time she can start the application process for the orange card.  She will need to follow up with [place] in clinic after that is obtained.           Pt discussed with Dr. Fredrich Romans, MD Internal Medicine Resident PGY-2 [facility] Pager: [phone] 05/22/2020 5:49 PM

## 2020-05-22 ENCOUNTER — Other Ambulatory Visit: Payer: Self-pay

## 2020-05-22 ENCOUNTER — Encounter: Payer: Self-pay | Admitting: Internal Medicine

## 2020-05-22 ENCOUNTER — Ambulatory Visit: Payer: BLUE CROSS/BLUE SHIELD | Admitting: Internal Medicine

## 2020-05-22 DIAGNOSIS — K7469 Other cirrhosis of liver: Secondary | ICD-10-CM

## 2020-05-22 DIAGNOSIS — Z599 Problem related to housing and economic circumstances, unspecified: Secondary | ICD-10-CM | POA: Insufficient documentation

## 2020-05-22 DIAGNOSIS — E785 Hyperlipidemia, unspecified: Secondary | ICD-10-CM

## 2020-05-22 DIAGNOSIS — Z0000 Encounter for general adult medical examination without abnormal findings: Secondary | ICD-10-CM

## 2020-05-22 DIAGNOSIS — B1920 Unspecified viral hepatitis C without hepatic coma: Secondary | ICD-10-CM

## 2020-05-22 DIAGNOSIS — I10 Essential (primary) hypertension: Secondary | ICD-10-CM

## 2020-05-22 NOTE — Patient Instructions (Signed)
I will have our clinical pharmacist call you tomorrow to document which medications you are on. I am also going to try to obtain the records from [facility]. Please arrange to meet with our financial counselor to discuss how we can best serve your needs.

## 2020-05-22 NOTE — Assessment & Plan Note (Addendum)
Breast cancer screening. Deferred at this time per pt request due to financial barriers. Upon obtaining coverage, will need Diagnostic mammography with right breast magnification views   Vaccinations: deferred due to financial barriers.

## 2020-05-22 NOTE — Assessment & Plan Note (Signed)
Unfortunately, her uninsured status and lack of financial means for out of pocket payment is precluding primary and secondary preventative measures as well as treatment for a right eye cataract. She is unable to readily afford medications at this time.  I confirmed she has an appointment scheduled with Ardis Rowan [facility] financial counselor, on 12/14, at which time she can start the application process for the orange card.  She will need to follow up with [place] in clinic after that is obtained.

## 2020-05-22 NOTE — Assessment & Plan Note (Addendum)
Follows with GI. She is due for [facility] screening however this is unable to be obtained due to financial barriers. Will have her follow up after orange card is obtained at which time I recommend ordering a screening RUQ [place].

## 2020-05-23 ENCOUNTER — Telehealth: Payer: Self-pay | Admitting: Pharmacist

## 2020-05-23 NOTE — Progress Notes (Signed)
Internal Medicine Clinic Attending  Case discussed with Dr. Christian  At the time of the visit.  We reviewed the resident's history and exam and pertinent patient test results.  I agree with the assessment, diagnosis, and plan of care documented in the resident's note.  

## 2020-05-23 NOTE — Telephone Encounter (Cosign Needed)
Dr. Elige Radon messaged me and asked me to call the patient to verify the medications she is currently taking as the patient was not sure of the name of all of them during her visit yesterday.  On the phone call with the patient she verbalized she was taking amlodipine 5mg  and atorvastatin 10mg . During her visit to [facility] this past week she states her BP was good in the 120's which she was surprised about as she had not taken her BP medications for about 6 months. Despite the patient reporting her BP was good, she states they still added a BP medication, HCTZ 25mg , to her regimen. The patient picked up the medication but has not begun taking it. The patient was not sure if she was supposed to stop the amlodipine 5mg  and start the HCTZ 25mg  or take both. At the moment we do not have her records from [facility] so unable to verify. BP in clinic was elevated yesterday at 150/91.  Patient was more concerned with her lack of vision in her eye which she states is cataracts. Getting assistance with her eye is her main goal for switching her care to internal medicine. I discussed with patient that we want to make sure her BP is under control to avoid further worsening her vision and delay in any care related to management of her eyes.   Spoke with Dr. and she stated the patient should continue with her regimen prescribed by [facility] until she can review their records. Upon receiving records from [facility], Dr. will discuss any changes she would like to make and I will call patient back with the plan.

## 2020-05-29 ENCOUNTER — Ambulatory Visit: Payer: Self-pay

## 2020-12-18 ENCOUNTER — Encounter: Payer: Self-pay | Admitting: *Deleted

## 2021-10-16 ENCOUNTER — Ambulatory Visit: Payer: Self-pay | Admitting: Family Medicine

## 2021-12-14 HISTORY — PX: EYE SURGERY: SHX253

## 2022-01-01 ENCOUNTER — Other Ambulatory Visit: Payer: Self-pay | Admitting: Neurosurgery

## 2022-01-23 NOTE — Pre-Procedure Instructions (Signed)
Surgical Instructions    Your procedure is scheduled on February 03, 2022.  Report to [facility] Main Entrance "A" at 5:30 A.M., then check in with the Admitting office.  Call this number if you have problems the morning of surgery:  [phone]   If you have any questions prior to your surgery date call [phone]: Open Monday-Friday 8am-4pm    Remember:  Do not eat or drink after midnight the night before your surgery     Take these medicines the morning of surgery with A SIP OF WATER:  amLODipine (NORVASC)   atorvastatin (LIPITOR)    As of today, STOP taking any Aspirin (unless otherwise instructed by your surgeon) Aleve, Naproxen, Ibuprofen, Motrin, Advil, Goody's, BC's, all herbal medications, fish oil, and all vitamins.                     Do NOT Smoke (Tobacco/Vaping) for 24 hours prior to your procedure.  If you use a CPAP at night, you may bring your mask/headgear for your overnight stay.   Contacts, glasses, piercing's, hearing aid's, dentures or partials may not be worn into surgery, please bring cases for these belongings.    For patients admitted to the hospital, discharge time will be determined by your treatment team.   Patients discharged the day of surgery will not be allowed to drive home, and someone needs to stay with them for 24 hours.  SURGICAL WAITING ROOM VISITATION Patients having surgery or a procedure may have no more than 2 support people in the waiting area - these visitors may rotate.   Children under the age of 103 must have an adult with them who is not the patient. If the patient needs to stay at the hospital during part of their recovery, the visitor guidelines for inpatient rooms apply. Pre-op nurse will coordinate an appropriate time for 1 support person to accompany patient in pre-op.  This support person may not rotate.   Please refer to the [facility] website for the visitor guidelines for Inpatients (after your surgery is over and you are  in a regular room).    Special instructions:   [facility]- Preparing For Surgery  Before surgery, you can play an important role. Because skin is not sterile, your skin needs to be as free of germs as possible. You can reduce the number of germs on your skin by washing with CHG (chlorahexidine gluconate) Soap before surgery.  CHG is an antiseptic cleaner which kills germs and bonds with the skin to continue killing germs even after washing.    Oral Hygiene is also important to reduce your risk of infection.  Remember - BRUSH YOUR TEETH THE MORNING OF SURGERY WITH YOUR REGULAR TOOTHPASTE  Please do not use if you have an allergy to CHG or antibacterial soaps. If your skin becomes reddened/irritated stop using the CHG.  Do not shave (including legs and underarms) for at least 48 hours prior to first CHG shower. It is OK to shave your face.  Please follow these instructions carefully.   Shower the NIGHT BEFORE SURGERY and the MORNING OF SURGERY  If you chose to wash your hair, wash your hair first as usual with your normal shampoo.  After you shampoo, rinse your hair and body thoroughly to remove the shampoo.  Use CHG Soap as you would any other liquid soap. You can apply CHG directly to the skin and wash gently with a scrungie or a clean washcloth.   Apply  the CHG Soap to your body ONLY FROM THE NECK DOWN.  Do not use on open wounds or open sores. Avoid contact with your eyes, ears, mouth and genitals (private parts). Wash Face and genitals (private parts)  with your normal soap.   Wash thoroughly, paying special attention to the area where your surgery will be performed.  Thoroughly rinse your body with warm water from the neck down.  DO NOT shower/wash with your normal soap after using and rinsing off the CHG Soap.  Pat yourself dry with a CLEAN TOWEL.  Wear CLEAN PAJAMAS to bed the night before surgery  Place CLEAN SHEETS on your bed the night before your surgery  DO NOT SLEEP  WITH PETS.   Day of Surgery: Take a shower with CHG soap. Do not wear jewelry or makeup Do not wear lotions, powders, perfumes/colognes, or deodorant. Do not shave 48 hours prior to surgery.   Do not bring valuables to the hospital.  [facility] is not responsible for any belongings or valuables. Do not wear nail polish, gel polish, artificial nails, or any other type of covering on natural nails (fingers and toes) If you have artificial nails or gel coating that need to be removed by a nail salon, please have this removed prior to surgery. Artificial nails or gel coating may interfere with anesthesia's ability to adequately monitor your vital signs.  Wear Clean/Comfortable clothing the morning of surgery Remember to brush your teeth WITH YOUR REGULAR TOOTHPASTE.   Please read over the following fact sheets that you were given.    If you received a COVID test during your pre-op visit  it is requested that you wear a mask when out in public, stay away from anyone that may not be feeling well and notify your surgeon if you develop symptoms. If you have been in contact with anyone that has tested positive in the last 10 days please notify you surgeon.

## 2022-01-24 ENCOUNTER — Encounter (HOSPITAL_COMMUNITY): Payer: Self-pay

## 2022-01-24 ENCOUNTER — Other Ambulatory Visit: Payer: Self-pay

## 2022-01-24 ENCOUNTER — Encounter (HOSPITAL_COMMUNITY)
Admission: RE | Admit: 2022-01-24 | Discharge: 2022-01-24 | Disposition: A | Discharge status: Home / Self Care | Payer: Medicare Other | Source: Ambulatory Visit | Attending: Neurosurgery | Admitting: Neurosurgery

## 2022-01-24 VITALS — BP 157/92 | HR 80 | Temp 98.3°F | Resp 18 | Ht 68.0 in | Wt 220.7 lb

## 2022-01-24 DIAGNOSIS — R7303 Prediabetes: Secondary | ICD-10-CM | POA: Diagnosis not present

## 2022-01-24 DIAGNOSIS — K769 Liver disease, unspecified: Secondary | ICD-10-CM | POA: Insufficient documentation

## 2022-01-24 DIAGNOSIS — I2510 Atherosclerotic heart disease of native coronary artery without angina pectoris: Secondary | ICD-10-CM | POA: Insufficient documentation

## 2022-01-24 DIAGNOSIS — Z01818 Encounter for other preprocedural examination: Secondary | ICD-10-CM | POA: Insufficient documentation

## 2022-01-24 HISTORY — DX: Prediabetes: R73.03

## 2022-01-24 HISTORY — DX: Pneumonia, unspecified organism: J18.9

## 2022-01-24 LAB — CBC
HCT: 43.8 % (ref 36.0–46.0)
Hemoglobin: 14.4 g/dL (ref 12.0–15.0)
MCH: 29 pg (ref 26.0–34.0)
MCHC: 32.9 g/dL (ref 30.0–36.0)
MCV: 88.3 fL (ref 80.0–100.0)
Platelets: 200 10*3/uL (ref 150–400)
RBC: 4.96 MIL/uL (ref 3.87–5.11)
RDW: 13.3 % (ref 11.5–15.5)
WBC: 6.1 10*3/uL (ref 4.0–10.5)
nRBC: 0 % (ref 0.0–0.2)

## 2022-01-24 LAB — COMPREHENSIVE METABOLIC PANEL
ALT: 18 U/L (ref 0–44)
AST: 19 U/L (ref 15–41)
Albumin: 3.9 g/dL (ref 3.5–5.0)
Alkaline Phosphatase: 56 U/L (ref 38–126)
Anion gap: 6 (ref 5–15)
BUN: 6 mg/dL — ABNORMAL LOW (ref 8–23)
CO2: 24 mmol/L (ref 22–32)
Calcium: 9.8 mg/dL (ref 8.9–10.3)
Chloride: 108 mmol/L (ref 98–111)
Creatinine, Ser: 0.68 mg/dL (ref 0.44–1.00)
GFR, Estimated: >60 mL/min (ref >60)
Glucose, Bld: 160 mg/dL — ABNORMAL HIGH (ref 70–99)
Potassium: 3.8 mmol/L (ref 3.5–5.1)
Sodium: 138 mmol/L (ref 135–145)
Total Bilirubin: 0.5 mg/dL (ref 0.3–1.2)
Total Protein: 7.1 g/dL (ref 6.5–8.1)

## 2022-01-24 LAB — SURGICAL PCR SCREEN
MRSA, PCR: NEGATIVE
Staphylococcus aureus: NEGATIVE

## 2022-01-24 LAB — HEMOGLOBIN A1C
Hgb A1c MFr Bld: 6.8 % — ABNORMAL HIGH (ref 4.8–5.6)
Mean Plasma Glucose: 148.46 mg/dL

## 2022-01-24 LAB — TYPE AND SCREEN
ABO/RH(D): O POS
Antibody Screen: NEGATIVE

## 2022-01-24 NOTE — Progress Notes (Signed)
PCP - Dr. Quincy Simmonds Cardiologist - Denies  PPM/ICD - Denies Device Orders - n/a Rep Notified - n/a  Chest x-ray - 02/19/2017 EKG - 01/24/2022 Stress Test - Denies ECHO - Denies Cardiac Cath - Denies  Sleep Study - Denies CPAP - n/a  Pre-Diabetic. A1c checked due to no recent history of result.  Blood Thinner Instructions: n/a Aspirin Instructions: Stop asa today  NPO after midnight  COVID TEST- n/a   Anesthesia review: No  Patient denies shortness of breath, fever, cough and chest pain at PAT appointment   All instructions explained to the patient, with a verbal understanding of the material. Patient agrees to go over the instructions while at home for a better understanding. Patient also instructed to self quarantine after being tested for COVID-19. The opportunity to ask questions was provided.

## 2022-02-02 NOTE — Anesthesia Preprocedure Evaluation (Signed)
Anesthesia Evaluation  Patient identified by MRN, date of birth, ID band Patient awake    Reviewed: Allergy & Precautions, NPO status   Airway Mallampati: II  TM Distance: >3 FB     Dental   Pulmonary pneumonia, Current Smoker and Patient abstained from smoking.,    breath sounds clear to auscultation       Cardiovascular hypertension,  Rhythm:Regular Rate:Normal     Neuro/Psych PSYCHIATRIC DISORDERS negative neurological ROS     GI/Hepatic (+) Hepatitis -Hx noted Dr. Chilton Si   Endo/Other  Hypothyroidism   Renal/GU negative Renal ROS     Musculoskeletal   Abdominal   Peds  Hematology   Anesthesia Other Findings   Reproductive/Obstetrics                            Anesthesia Physical Anesthesia Plan  ASA: 3  Anesthesia Plan: General   Post-op Pain Management:    Induction:   PONV Risk Score and Plan: 2 and Ondansetron, Dexamethasone and Midazolam  Airway Management Planned: Oral ETT  Additional Equipment:   Intra-op Plan:   Post-operative Plan: Possible Post-op intubation/ventilation  Informed Consent: I have reviewed the patients History and Physical, chart, labs and discussed the procedure including the risks, benefits and alternatives for the proposed anesthesia with the patient or authorized representative who has indicated his/her understanding and acceptance.     Dental advisory given  Plan Discussed with: CRNA and Anesthesiologist  Anesthesia Plan Comments:         Anesthesia Quick Evaluation

## 2022-02-03 ENCOUNTER — Other Ambulatory Visit: Payer: Self-pay

## 2022-02-03 ENCOUNTER — Ambulatory Visit (HOSPITAL_COMMUNITY): Payer: Medicare Other | Admitting: Anesthesiology

## 2022-02-03 ENCOUNTER — Encounter (HOSPITAL_COMMUNITY): Payer: Self-pay | Admitting: Neurosurgery

## 2022-02-03 ENCOUNTER — Ambulatory Visit (HOSPITAL_COMMUNITY): Payer: Medicare Other

## 2022-02-03 ENCOUNTER — Encounter (HOSPITAL_COMMUNITY)
Admission: RE | Disposition: A | Discharge status: Home / Self Care | Payer: Self-pay | Source: Home / Self Care | Attending: Neurosurgery

## 2022-02-03 ENCOUNTER — Ambulatory Visit (HOSPITAL_BASED_OUTPATIENT_CLINIC_OR_DEPARTMENT_OTHER): Payer: Medicare Other | Admitting: Anesthesiology

## 2022-02-03 ENCOUNTER — Ambulatory Visit (HOSPITAL_COMMUNITY)
Admission: RE | Admit: 2022-02-03 | Discharge: 2022-02-04 | Disposition: A | Discharge status: Home / Self Care | Payer: Medicare Other | Attending: Neurosurgery | Admitting: Neurosurgery

## 2022-02-03 DIAGNOSIS — I10 Essential (primary) hypertension: Secondary | ICD-10-CM | POA: Diagnosis not present

## 2022-02-03 DIAGNOSIS — M48061 Spinal stenosis, lumbar region without neurogenic claudication: Secondary | ICD-10-CM

## 2022-02-03 DIAGNOSIS — M4316 Spondylolisthesis, lumbar region: Secondary | ICD-10-CM | POA: Insufficient documentation

## 2022-02-03 DIAGNOSIS — F17210 Nicotine dependence, cigarettes, uncomplicated: Secondary | ICD-10-CM

## 2022-02-03 DIAGNOSIS — M5416 Radiculopathy, lumbar region: Secondary | ICD-10-CM | POA: Insufficient documentation

## 2022-02-03 DIAGNOSIS — M4310 Spondylolisthesis, site unspecified: Secondary | ICD-10-CM | POA: Diagnosis present

## 2022-02-03 DIAGNOSIS — R7303 Prediabetes: Secondary | ICD-10-CM

## 2022-02-03 LAB — ABO/RH: ABO/RH(D): O POS

## 2022-02-03 SURGERY — POSTERIOR LUMBAR FUSION 1 LEVEL
Anesthesia: General | Site: Back

## 2022-02-03 MED ORDER — MENTHOL 3 MG MT LOZG: 1.0000 | LOZENGE | OROMUCOSAL | Status: DC | PRN

## 2022-02-03 MED ORDER — ROCURONIUM BROMIDE 10 MG/ML (PF) SYRINGE
PREFILLED_SYRINGE | INTRAVENOUS | Status: DC | PRN
  Administered 2022-02-03: 10 mg via INTRAVENOUS
  Administered 2022-02-03: 50 mg via INTRAVENOUS
  Administered 2022-02-03: 20 mg via INTRAVENOUS

## 2022-02-03 MED ORDER — SODIUM CHLORIDE 0.9% FLUSH: 3.0000 mL | INTRAVENOUS | Status: DC | PRN

## 2022-02-03 MED ORDER — ONDANSETRON HCL 4 MG PO TABS: 4.0000 mg | ORAL_TABLET | Freq: Four times a day (QID) | ORAL | Status: DC | PRN

## 2022-02-03 MED ORDER — PHENOL 1.4 % MT LIQD
1.0000 | OROMUCOSAL | Status: DC | PRN
Start: 2022-02-03 — End: 2022-02-04

## 2022-02-03 MED ORDER — ALUM & MAG HYDROXIDE-SIMETH 200-200-20 MG/5ML PO SUSP: 30.0000 mL | Freq: Four times a day (QID) | ORAL | Status: DC | PRN

## 2022-02-03 MED ORDER — PROPOFOL 10 MG/ML IV BOLUS
INTRAVENOUS | Status: AC
  Filled 2022-02-03: qty 20

## 2022-02-03 MED ORDER — DEXAMETHASONE SODIUM PHOSPHATE 10 MG/ML IJ SOLN
INTRAMUSCULAR | Status: AC
Start: 2022-02-03 — End: ?
  Filled 2022-02-03: qty 1

## 2022-02-03 MED ORDER — ACETAMINOPHEN 325 MG PO TABS: 650.0000 mg | ORAL_TABLET | ORAL | Status: DC | PRN

## 2022-02-03 MED ORDER — FENTANYL CITRATE (PF) 250 MCG/5ML IJ SOLN
INTRAMUSCULAR | Status: AC
  Filled 2022-02-03: qty 5

## 2022-02-03 MED ORDER — LACTATED RINGERS IV SOLN: INTRAVENOUS | Status: DC

## 2022-02-03 MED ORDER — LIDOCAINE-EPINEPHRINE 1 %-1:100000 IJ SOLN
INTRAMUSCULAR | Status: AC
Start: 2022-02-03 — End: ?
  Filled 2022-02-03: qty 1

## 2022-02-03 MED ORDER — MIDAZOLAM HCL 2 MG/2ML IJ SOLN
INTRAMUSCULAR | Status: DC | PRN
  Administered 2022-02-03: 2 mg via INTRAVENOUS

## 2022-02-03 MED ORDER — THROMBIN 20000 UNITS EX SOLR
CUTANEOUS | Status: AC
Start: 2022-02-03 — End: ?
  Filled 2022-02-03: qty 20000

## 2022-02-03 MED ORDER — MIDAZOLAM HCL 2 MG/2ML IJ SOLN
INTRAMUSCULAR | Status: AC
  Filled 2022-02-03: qty 2

## 2022-02-03 MED ORDER — SUGAMMADEX SODIUM 200 MG/2ML IV SOLN
INTRAVENOUS | Status: DC | PRN
  Administered 2022-02-03: 200 mg via INTRAVENOUS

## 2022-02-03 MED ORDER — LIDOCAINE 2% (20 MG/ML) 5 ML SYRINGE
INTRAMUSCULAR | Status: DC | PRN
  Administered 2022-02-03: 80 mg via INTRAVENOUS
  Administered 2022-02-03: 20 mg via INTRAVENOUS

## 2022-02-03 MED ORDER — PHENYLEPHRINE 80 MCG/ML (10ML) SYRINGE FOR IV PUSH (FOR BLOOD PRESSURE SUPPORT)
PREFILLED_SYRINGE | INTRAVENOUS | Status: DC | PRN
  Administered 2022-02-03 (×3): 80 ug via INTRAVENOUS

## 2022-02-03 MED ORDER — ACETAMINOPHEN 650 MG RE SUPP: 650.0000 mg | RECTAL | Status: DC | PRN

## 2022-02-03 MED ORDER — LIDOCAINE-EPINEPHRINE 1 %-1:100000 IJ SOLN
INTRAMUSCULAR | Status: DC | PRN
  Administered 2022-02-03: 10 mL

## 2022-02-03 MED ORDER — DEXAMETHASONE SODIUM PHOSPHATE 10 MG/ML IJ SOLN
INTRAMUSCULAR | Status: DC | PRN
  Administered 2022-02-03: 10 mg via INTRAVENOUS

## 2022-02-03 MED ORDER — SUCCINYLCHOLINE CHLORIDE 200 MG/10ML IV SOSY
PREFILLED_SYRINGE | INTRAVENOUS | Status: AC
  Filled 2022-02-03: qty 10

## 2022-02-03 MED ORDER — EPHEDRINE 5 MG/ML INJ
INTRAVENOUS | Status: AC
  Filled 2022-02-03: qty 5

## 2022-02-03 MED ORDER — HYDROCODONE-ACETAMINOPHEN 5-325 MG PO TABS
2.0000 | ORAL_TABLET | ORAL | Status: DC | PRN
  Administered 2022-02-03 – 2022-02-04 (×6): 2 via ORAL
  Filled 2022-02-03 (×6): qty 2

## 2022-02-03 MED ORDER — CEFAZOLIN SODIUM-DEXTROSE 2-4 GM/100ML-% IV SOLN
2.0000 g | Freq: Three times a day (TID) | INTRAVENOUS | Status: AC
  Administered 2022-02-03 (×2): 2 g via INTRAVENOUS
  Filled 2022-02-03 (×2): qty 100

## 2022-02-03 MED ORDER — CHLORHEXIDINE GLUCONATE 0.12 % MT SOLN
15.0000 mL | Freq: Once | OROMUCOSAL | Status: AC
  Administered 2022-02-03: 15 mL via OROMUCOSAL
  Filled 2022-02-03: qty 15

## 2022-02-03 MED ORDER — ONDANSETRON HCL 4 MG/2ML IJ SOLN
INTRAMUSCULAR | Status: DC | PRN
  Administered 2022-02-03: 4 mg via INTRAVENOUS

## 2022-02-03 MED ORDER — PREDNISOL ACE-MOXIFLOX-BROMFEN 1-0.5-0.075 % OP SUSP: 1.0000 [drp] | Freq: Three times a day (TID) | OPHTHALMIC | Status: DC

## 2022-02-03 MED ORDER — SODIUM CHLORIDE 0.9% FLUSH
3.0000 mL | Freq: Two times a day (BID) | INTRAVENOUS | Status: DC
  Administered 2022-02-03 (×2): 3 mL via INTRAVENOUS

## 2022-02-03 MED ORDER — PROPOFOL 10 MG/ML IV BOLUS
INTRAVENOUS | Status: DC | PRN
  Administered 2022-02-03: 130 mg via INTRAVENOUS
  Administered 2022-02-03: 20 mg via INTRAVENOUS

## 2022-02-03 MED ORDER — LIDOCAINE 2% (20 MG/ML) 5 ML SYRINGE
INTRAMUSCULAR | Status: AC
  Filled 2022-02-03: qty 5

## 2022-02-03 MED ORDER — ONDANSETRON HCL 4 MG/2ML IJ SOLN
4.0000 mg | Freq: Four times a day (QID) | INTRAMUSCULAR | Status: DC | PRN
  Administered 2022-02-03: 4 mg via INTRAVENOUS
  Filled 2022-02-03: qty 2

## 2022-02-03 MED ORDER — 0.9 % SODIUM CHLORIDE (POUR BTL) OPTIME
TOPICAL | Status: DC | PRN
  Administered 2022-02-03: 1000 mL

## 2022-02-03 MED ORDER — THROMBIN 20000 UNITS EX SOLR: CUTANEOUS | Status: DC | PRN

## 2022-02-03 MED ORDER — PHENYLEPHRINE 80 MCG/ML (10ML) SYRINGE FOR IV PUSH (FOR BLOOD PRESSURE SUPPORT)
PREFILLED_SYRINGE | INTRAVENOUS | Status: AC
Start: 2022-02-03 — End: ?
  Filled 2022-02-03: qty 10

## 2022-02-03 MED ORDER — CYCLOBENZAPRINE HCL 10 MG PO TABS
10.0000 mg | ORAL_TABLET | Freq: Three times a day (TID) | ORAL | Status: DC | PRN
  Administered 2022-02-03 (×2): 10 mg via ORAL
  Filled 2022-02-03 (×2): qty 1

## 2022-02-03 MED ORDER — ATORVASTATIN CALCIUM 10 MG PO TABS
20.0000 mg | ORAL_TABLET | Freq: Every day | ORAL | Status: DC
  Administered 2022-02-03: 20 mg via ORAL
  Filled 2022-02-03: qty 2

## 2022-02-03 MED ORDER — HYDROMORPHONE HCL 1 MG/ML IJ SOLN
INTRAMUSCULAR | Status: AC
  Filled 2022-02-03: qty 1

## 2022-02-03 MED ORDER — ORAL CARE MOUTH RINSE: 15.0000 mL | Freq: Once | OROMUCOSAL | Status: AC

## 2022-02-03 MED ORDER — HYDROMORPHONE HCL 1 MG/ML IJ SOLN
0.2500 mg | INTRAMUSCULAR | Status: DC | PRN
  Administered 2022-02-03 (×2): 0.25 mg via INTRAVENOUS
  Administered 2022-02-03: 0.5 mg via INTRAVENOUS

## 2022-02-03 MED ORDER — EPHEDRINE SULFATE-NACL 50-0.9 MG/10ML-% IV SOSY
PREFILLED_SYRINGE | INTRAVENOUS | Status: DC | PRN
  Administered 2022-02-03 (×2): 5 mg via INTRAVENOUS

## 2022-02-03 MED ORDER — HYDROMORPHONE HCL 1 MG/ML IJ SOLN: 0.5000 mg | INTRAMUSCULAR | Status: DC | PRN

## 2022-02-03 MED ORDER — FENTANYL CITRATE (PF) 250 MCG/5ML IJ SOLN
INTRAMUSCULAR | Status: DC | PRN
  Administered 2022-02-03: 100 ug via INTRAVENOUS
  Administered 2022-02-03 (×2): 50 ug via INTRAVENOUS

## 2022-02-03 MED ORDER — SODIUM CHLORIDE 0.9 % IV SOLN
250.0000 mL | INTRAVENOUS | Status: DC
  Administered 2022-02-03: 250 mL via INTRAVENOUS

## 2022-02-03 MED ORDER — BUPIVACAINE LIPOSOME 1.3 % IJ SUSP
INTRAMUSCULAR | Status: DC | PRN
  Administered 2022-02-03: 10 mL

## 2022-02-03 MED ORDER — PANTOPRAZOLE SODIUM 40 MG IV SOLR: 40.0000 mg | Freq: Every day | INTRAVENOUS | Status: DC

## 2022-02-03 MED ORDER — LACTATED RINGERS IV SOLN: INTRAVENOUS | Status: DC | PRN

## 2022-02-03 MED ORDER — CHLORHEXIDINE GLUCONATE CLOTH 2 % EX PADS: 6.0000 | MEDICATED_PAD | Freq: Once | CUTANEOUS | Status: DC

## 2022-02-03 MED ORDER — ONDANSETRON HCL 4 MG/2ML IJ SOLN
INTRAMUSCULAR | Status: AC
  Filled 2022-02-03: qty 2

## 2022-02-03 MED ORDER — CEFAZOLIN SODIUM-DEXTROSE 2-4 GM/100ML-% IV SOLN
2.0000 g | INTRAVENOUS | Status: AC
  Administered 2022-02-03: 2 g via INTRAVENOUS
  Filled 2022-02-03: qty 100

## 2022-02-03 MED ORDER — SUCCINYLCHOLINE CHLORIDE 200 MG/10ML IV SOSY
PREFILLED_SYRINGE | INTRAVENOUS | Status: DC | PRN
  Administered 2022-02-03: 110 mg via INTRAVENOUS

## 2022-02-03 MED ORDER — BUPIVACAINE LIPOSOME 1.3 % IJ SUSP
INTRAMUSCULAR | Status: AC
  Filled 2022-02-03: qty 20

## 2022-02-03 MED ORDER — PANTOPRAZOLE SODIUM 40 MG PO TBEC
40.0000 mg | DELAYED_RELEASE_TABLET | Freq: Every day | ORAL | Status: DC
  Administered 2022-02-03: 40 mg via ORAL
  Filled 2022-02-03: qty 1

## 2022-02-03 MED ORDER — PHENYLEPHRINE HCL-NACL 20-0.9 MG/250ML-% IV SOLN
INTRAVENOUS | Status: DC | PRN
  Administered 2022-02-03: 20 ug/min via INTRAVENOUS

## 2022-02-03 MED ORDER — ROCURONIUM BROMIDE 10 MG/ML (PF) SYRINGE
PREFILLED_SYRINGE | INTRAVENOUS | Status: AC
Start: 2022-02-03 — End: ?
  Filled 2022-02-03: qty 10

## 2022-02-03 SURGICAL SUPPLY — 78 items
ADH SKN CLS APL DERMABOND .7 (GAUZE/BANDAGES/DRESSINGS) ×2
APL SKNCLS STERI-STRIP NONHPOA (GAUZE/BANDAGES/DRESSINGS) ×1
BAG COUNTER SPONGE SURGICOUNT (BAG) ×1 IMPLANT
BAG SPNG CNTER NS LX DISP (BAG) ×1
BASKET BONE COLLECTION (BASKET) ×1 IMPLANT
BENZOIN TINCTURE PRP APPL 2/3 (GAUZE/BANDAGES/DRESSINGS) ×1 IMPLANT
BLADE BONE MILL MEDIUM (MISCELLANEOUS) ×1 IMPLANT
BLADE CLIPPER SURG (BLADE) IMPLANT
BLADE SURG 11 STRL SS (BLADE) ×1 IMPLANT
BONE VIVIGEN FORMABLE 5.4CC (Bone Implant) ×1 IMPLANT
BUR CUTTER 7.0 ROUND (BURR) ×1 IMPLANT
BUR MATCHSTICK NEURO 3.0 LAGG (BURR) ×1 IMPLANT
CANISTER SUCT 3000ML PPV (MISCELLANEOUS) ×1 IMPLANT
CAP LOCKING THREADED (Cap) IMPLANT
CARTRIDGE OIL MAESTRO DRILL (MISCELLANEOUS) ×1 IMPLANT
CLSR STERI-STRIP ANTIMIC 1/2X4 (GAUZE/BANDAGES/DRESSINGS) IMPLANT
CNTNR URN SCR LID CUP LEK RST (MISCELLANEOUS) ×1 IMPLANT
CONT SPEC 4OZ STRL OR WHT (MISCELLANEOUS) ×1
COVER BACK TABLE 60X90IN (DRAPES) ×1 IMPLANT
DERMABOND ADVANCED (GAUZE/BANDAGES/DRESSINGS) ×2
DERMABOND ADVANCED .7 DNX12 (GAUZE/BANDAGES/DRESSINGS) ×1 IMPLANT
DIFFUSER DRILL AIR PNEUMATIC (MISCELLANEOUS) ×1 IMPLANT
DRAPE C-ARM 42X72 X-RAY (DRAPES) ×2 IMPLANT
DRAPE C-ARMOR (DRAPES) IMPLANT
DRAPE HALF SHEET 40X57 (DRAPES) IMPLANT
DRAPE LAPAROTOMY 100X72X124 (DRAPES) ×1 IMPLANT
DRAPE SURG 17X23 STRL (DRAPES) ×1 IMPLANT
DRSG OPSITE 4X5.5 SM (GAUZE/BANDAGES/DRESSINGS) ×1 IMPLANT
DRSG OPSITE POSTOP 4X6 (GAUZE/BANDAGES/DRESSINGS) ×1 IMPLANT
DURAPREP 26ML APPLICATOR (WOUND CARE) ×1 IMPLANT
ELECT REM PT RETURN 9FT ADLT (ELECTROSURGICAL) ×1
ELECTRODE REM PT RTRN 9FT ADLT (ELECTROSURGICAL) ×1 IMPLANT
EVACUATOR 3/16  PVC DRAIN (DRAIN)
EVACUATOR 3/16 PVC DRAIN (DRAIN) IMPLANT
GAUZE 4X4 16PLY ~~LOC~~+RFID DBL (SPONGE) IMPLANT
GAUZE SPONGE 4X4 12PLY STRL (GAUZE/BANDAGES/DRESSINGS) ×1 IMPLANT
GLOVE BIO SURGEON STRL SZ7 (GLOVE) IMPLANT
GLOVE BIO SURGEON STRL SZ8 (GLOVE) ×2 IMPLANT
GLOVE BIOGEL PI IND STRL 7.0 (GLOVE) IMPLANT
GLOVE BIOGEL PI INDICATOR 7.0 (GLOVE) ×3
GLOVE EXAM NITRILE XL STR (GLOVE) IMPLANT
GLOVE INDICATOR 8.5 STRL (GLOVE) ×4 IMPLANT
GOWN STRL REUS W/ TWL LRG LVL3 (GOWN DISPOSABLE) IMPLANT
GOWN STRL REUS W/ TWL XL LVL3 (GOWN DISPOSABLE) ×2 IMPLANT
GOWN STRL REUS W/TWL 2XL LVL3 (GOWN DISPOSABLE) IMPLANT
GOWN STRL REUS W/TWL LRG LVL3 (GOWN DISPOSABLE) ×2
GOWN STRL REUS W/TWL XL LVL3 (GOWN DISPOSABLE) ×2
GRAFT BNE MATRIX VG FRMBL MD 5 (Bone Implant) IMPLANT
GRAFT BONE PROTEIOS LRG 5CC (Orthopedic Implant) IMPLANT
KIT BASIN OR (CUSTOM PROCEDURE TRAY) ×1 IMPLANT
KIT GRAFTMAG DEL NEURO DISP (NEUROSURGERY SUPPLIES) IMPLANT
KIT TURNOVER KIT B (KITS) ×1 IMPLANT
MILL BONE PREP (MISCELLANEOUS) ×1 IMPLANT
NDL HYPO 21X1.5 SAFETY (NEEDLE) IMPLANT
NDL HYPO 25X1 1.5 SAFETY (NEEDLE) ×1 IMPLANT
NEEDLE HYPO 21X1.5 SAFETY (NEEDLE) ×1 IMPLANT
NEEDLE HYPO 25X1 1.5 SAFETY (NEEDLE) ×1 IMPLANT
NS IRRIG 1000ML POUR BTL (IV SOLUTION) ×1 IMPLANT
OIL CARTRIDGE MAESTRO DRILL (MISCELLANEOUS) ×1
PACK LAMINECTOMY NEURO (CUSTOM PROCEDURE TRAY) ×1 IMPLANT
PAD ARMBOARD 7.5X6 YLW CONV (MISCELLANEOUS) ×3 IMPLANT
ROD 40MM SPINAL (Rod) IMPLANT
SCREW PA THRD CREO TULIP 5.5X4 (Head) IMPLANT
SHAFT CREO 30MM (Neuro Prosthesis/Implant) IMPLANT
SPACER SUSTAIN TI 9X22X12 8D (Spacer) IMPLANT
SPIKE FLUID TRANSFER (MISCELLANEOUS) ×1 IMPLANT
SPONGE SURGIFOAM ABS GEL 100 (HEMOSTASIS) ×1 IMPLANT
SPONGE T-LAP 4X18 ~~LOC~~+RFID (SPONGE) IMPLANT
STRIP CLOSURE SKIN 1/2X4 (GAUZE/BANDAGES/DRESSINGS) ×2 IMPLANT
SUT VIC AB 0 CT1 18XCR BRD8 (SUTURE) ×2 IMPLANT
SUT VIC AB 0 CT1 8-18 (SUTURE) ×2
SUT VIC AB 2-0 CT1 18 (SUTURE) ×1 IMPLANT
SUT VIC AB 4-0 PS2 27 (SUTURE) ×1 IMPLANT
SYR 20ML LL LF (SYRINGE) IMPLANT
TOWEL GREEN STERILE (TOWEL DISPOSABLE) ×1 IMPLANT
TOWEL GREEN STERILE FF (TOWEL DISPOSABLE) ×1 IMPLANT
TRAY FOLEY MTR SLVR 16FR STAT (SET/KITS/TRAYS/PACK) ×1 IMPLANT
WATER STERILE IRR 1000ML POUR (IV SOLUTION) ×1 IMPLANT

## 2022-02-03 NOTE — Anesthesia Procedure Notes (Signed)
Procedure Name: Intubation Date/Time: 02/03/2022 7:42 AM  Performed by: Harden Mo, CRNAPre-anesthesia Checklist: Patient identified, Emergency Drugs available, Suction available and Patient being monitored Patient Re-evaluated:Patient Re-evaluated prior to induction Oxygen Delivery Method: Circle System Utilized Preoxygenation: Pre-oxygenation with 100% oxygen Induction Type: IV induction and Rapid sequence Laryngoscope Size: Mac and 3 Grade View: Grade I Tube type: Oral Tube size: 7.0 mm Number of attempts: 1 Airway Equipment and Method: Stylet and Oral airway Placement Confirmation: ETT inserted through vocal cords under direct vision, positive ETCO2 and breath sounds checked- equal and bilateral Secured at: 22 cm Tube secured with: Tape Dental Injury: Teeth and Oropharynx as per pre-operative assessment

## 2022-02-03 NOTE — Anesthesia Postprocedure Evaluation (Signed)
Anesthesia Post Note  Patient: Programmer, multimedia  Procedure(s) Performed: Posterior Lumbar Interbody Fusion - L4-L5 - Interbody Fusion (Back)     Patient location during evaluation: PACU Anesthesia Type: General Level of consciousness: awake Pain management: pain level controlled Vital Signs Assessment: post-procedure vital signs reviewed and stable Respiratory status: spontaneous breathing Cardiovascular status: stable Postop Assessment: no apparent nausea or vomiting Anesthetic complications: no   No notable events documented.  Last Vitals:  Vitals:   02/03/22 1200 02/03/22 1217  BP: (!) 157/93 (!) 146/87  Pulse: 64 64  Resp: 18 17  Temp:  36.5 C  SpO2: 95% 96%    Last Pain:  Vitals:   02/03/22 1145  TempSrc:   PainSc: Asleep                 Offie Pickron

## 2022-02-03 NOTE — Transfer of Care (Signed)
Immediate Anesthesia Transfer of Care Note  Patient: Melanie Guzman  Procedure(s) Performed: Posterior Lumbar Interbody Fusion - L4-L5 - Interbody Fusion (Back)  Patient Location: PACU  Anesthesia Type:General  Level of Consciousness: awake and drowsy  Airway & Oxygen Therapy: Patient Spontanous Breathing and Patient connected to face mask oxygen  Post-op Assessment: Report given to RN, Post -op Vital signs reviewed and stable and Patient moving all extremities X 4  Post vital signs: Reviewed and stable  Last Vitals:  Vitals Value Taken Time  BP 133/75 02/03/22 1048  Temp    Pulse 78 02/03/22 1049  Resp 21 02/03/22 1049  SpO2 97 % 02/03/22 1049  Vitals shown include unvalidated device data.  Last Pain:  Vitals:   02/03/22 0619  TempSrc:   PainSc: 7       Patients Stated Pain Goal: 4 (02/03/22 9735)  Complications: No notable events documented.

## 2022-02-03 NOTE — Op Note (Signed)
Preoperative diagnosis: Grade 1 spondylolisthesis L4-5 lumbar spinal stenosis L4-5 bilateral L4-L5 radiculopathies.  Postoperative diagnosis: Same.  Procedure: #1 decompressive lumbar laminectomy L4-5 with complete medial facetectomies radical foraminotomies and removal of the pars in excess and requiring more work than would be needed with a standard interbody fusion..  2.  Posterior lumbar interbody fusion utilizing the globus insert and rotate titanium cages packed with locally harvested autograft mixed with Vivigen and Proteus.  3.  Cortical screw fixation L4-5 utilizing the globus Creo amp modular cortical screw set.  4.  Open reduction spinal deformity.  Surgeon: Donalee Citrin.  Assistant: Julien Girt  EBL: Minimal  HPI: 66 year old female progressive worsening back and bilateral bilateral leg pain worse on the left work-up revealed severe spinal stenosis and a grade 1 spondylolisthesis with instability at L4-5 and due to patient's progression of clinical syndrome imaging findings and failed conservative treatment I recommended decompressive laminectomy interbody fusion at that level.  I extensively reviewed the risks and benefits of that procedure with her as well as perioperative course expectations of outcome and alternatives of surgery and she understood and agreed to proceed forward.  Operative procedure: Patient was brought into the OR was induced under general anesthesia positioned prone on the Wilson frame her back was prepped and draped in routine sterile fashion.  After infiltration of 10 cc lidocaine with epi midline incision was made and Bovie electrocautery was used to take down the subcutaneous tissue and subperiosteal dissection was carried lamina of L4 and L5 bilaterally.  Intraoperative x-ray confirmed defecation appropriate level so left bilateral laminotomies were begun with complete facetectomies preserving the spinous process and interspinous ligament.  I performed and  radical foraminotomies of the L4 and L5 nerve root removing the pars at L4 under biting the supra reticulating facet at L5 to gain access lateral margin of disc base both foramina were widely decompressed and removed.  Disc base was then incised and cleaned out bilaterally utilizing sequential distraction I reduce the deformity and with an 11 distractor in place a selected 9 to 12 mm 8 degree cage is 22 mm in length.  Packed with locally harvested autograft mix.  Inserted bilaterally with an extensive mount of autograft mix centrally after adequate discectomy and endplate preparation.  This significantly reduce the deformity and opened up the foramina and decompress the central canal then I placed bilateral cortical screws at L4 and L5 under fluoroscopy all screws had excellent purchase I then assembled the heads placed the rods compressed L4 against L5 reinspected the foramina to confirm patency lay Gelfoam and top of the dura and closed the wound in layers with interrupted Vicryl injected Exparel in the fascia and closed the skin with a running 4 subcuticular.  Dermabond benzoin Steri-Strips and sterile dressing was applied patient recovery in stable condition.  At the end the case all needle count sponge counts were correct.

## 2022-02-03 NOTE — H&P (Signed)
Melanie Guzman is an 66 y.o. female.   Chief Complaint: Back and left leg pain HPI: 66 year old female with back and left leg pain consistent with L4-L5 nerve root pattern.  Work-up revealed severe spinal stenosis and slight listhesis at L4-5.  Due to patient's progression of clinical syndrome imaging findings and failure of conservative treatment I recommended decompressive laminectomy interbody fusion L4-5.  We extensively reviewed the risks and benefits of the operation with her as well as perioperative course expectations of outcome and alternatives of surgery and she understood and agreed to proceed forward.  Past Medical History:  Diagnosis Date   Back pain    Chronic bronchitis (HCC)    Cirrhosis (HCC)    Hepatitis C    Hypertension    Pancreatitis    Pneumonia    had PNA twice   Pre-diabetes    Upper airway cough syndrome 01/29/2018   URI (upper respiratory infection) 04/01/2018    Past Surgical History:  Procedure Laterality Date   ABDOMINAL HYSTERECTOMY     partial   EYE SURGERY Left 12/2021   Cataract. Right eye in 2022    Family History  Problem Relation Age of Onset   Heart attack Mother    Stroke Maternal Grandmother    Bone cancer Maternal Grandfather    Cancer Maternal Grandfather    Pancreatic cancer Paternal Grandmother    Colon cancer Neg Hx    Social History:  reports that she has been smoking cigarettes. She has a 12.00 pack-year smoking history. She has never used smokeless tobacco. She reports that she does not drink alcohol and does not use drugs.  Allergies: No Known Allergies  Medications Prior to Admission  Medication Sig Dispense Refill   atorvastatin (LIPITOR) 20 MG tablet Take 20 mg by mouth daily.     diclofenac (VOLTAREN) 75 MG EC tablet Take 75 mg by mouth 2 (two) times daily.     Prednisol Ace-Moxiflox-Bromfen 1-0.5-0.075 % SUSP Place 1 drop into the left eye 3 (three) times daily.      No results found for this or any previous visit  (from the past 48 hour(s)). No results found.  Review of Systems  Musculoskeletal:  Positive for back pain.  Neurological:  Positive for numbness.    Blood pressure (!) 148/89, pulse 76, temperature 97.8 F (36.6 C), temperature source Oral, resp. rate 18, height 5\' 8"  (1.727 m), weight 99.8 kg, SpO2 94 %. Physical Exam HENT:     Right Ear: Tympanic membrane normal.     Nose: Nose normal.  Eyes:     Pupils: Pupils are equal, round, and reactive to light.  Cardiovascular:     Rate and Rhythm: Normal rate.  Pulmonary:     Effort: Pulmonary effort is normal.  Abdominal:     General: Abdomen is flat.  Musculoskeletal:        General: Normal range of motion.  Skin:    General: Skin is warm.  Neurological:     Mental Status: She is alert.     Comments: Patient is awake and alert strength is 5 out of 5 iliopsoas, quads, hamstrings, gastrocs, tibialis, and EHL.      Assessment/Plan 66 year old female presents for decompression stabilization L4-5  76, MD 02/03/2022, 7:25 AM

## 2022-02-04 DIAGNOSIS — M5416 Radiculopathy, lumbar region: Secondary | ICD-10-CM | POA: Diagnosis not present

## 2022-02-04 MED ORDER — METHOCARBAMOL 750 MG PO TABS: 750.0000 mg | ORAL_TABLET | Freq: Four times a day (QID) | ORAL | 0 refills | Status: AC

## 2022-02-04 MED ORDER — HYDROCODONE-ACETAMINOPHEN 5-325 MG PO TABS
1.0000 | ORAL_TABLET | ORAL | 0 refills | Status: AC | PRN
Start: 2022-02-04 — End: 2023-02-04

## 2022-02-04 NOTE — Evaluation (Addendum)
Occupational Therapy Evaluation Patient Details Name: Melanie Guzman MRN: [id number] DOB: 04-03-1956 Today's Date: 02/04/2022   History of Present Illness Pt is a 66 y/o F s/p PLIF L4-5, PMH includes back pain, PNA, HTN, and pancreatitis.   Clinical Impression   Pt independent at baseline with ADLs and mobility, lives in 7th floor apartment with elevator. Pt has a neighbor available to help her PRN at d/c. Pt currently needing mod I -min A for ADLs, and min guard for transfers with RW. Pt educated on precautions and compensatory strategies for ADLs, pt verbalized understanding, needing min cueing to perform. Pt performing bed mobility using log roll technique with supervision, practiced without use of bedrail to simulate home environment, as pt has air mattress. Also demo'd use of AE for pt and resources for where to find/purchase. Pt presenting with impairments listed below, will follow acutely. Recommend HHOT at d/c.      Recommendations for follow up therapy are one component of a multi-disciplinary discharge planning process, led by the attending physician.  Recommendations may be updated based on patient status, additional functional criteria and insurance authorization.   Follow Up Recommendations  Home health OT    Assistance Recommended at Discharge Set up Supervision/Assistance  Patient can return home with the following A little help with walking and/or transfers;A little help with bathing/dressing/bathroom;Assistance with cooking/housework;Assist for transportation;Help with stairs or ramp for entrance    Functional Status Assessment  Patient has had a recent decline in their functional status and demonstrates the ability to make significant improvements in function in a reasonable and predictable amount of time.  Equipment Recommendations  BSC/3in1;Other (comment) (RW)    Recommendations for Other Services PT consult     Precautions / Restrictions Precautions Precautions:  Back Precaution Booklet Issued: Yes (comment) Precaution Comments: reviewed 3/3 back precautions Required Braces or Orthoses: Other Brace Other Brace: no brace needed per orders, however pt has brace Restrictions Weight Bearing Restrictions: No      Mobility Bed Mobility Overal bed mobility: Needs Assistance Bed Mobility: Rolling, Sit to Sidelying, Sidelying to Sit Rolling: Supervision       Sit to sidelying: Supervision General bed mobility comments: pt EOB upon arrival    Transfers Overall transfer level: Needs assistance Equipment used: Rolling walker (2 wheels) Transfers: Sit to/from Stand Sit to Stand: Min guard                  Balance Overall balance assessment: Needs assistance Sitting-balance support: Bilateral upper extremity supported, Feet unsupported Sitting balance-Leahy Scale: Good     Standing balance support: During functional activity, Reliant on assistive device for balance Standing balance-Leahy Scale: Poor Standing balance comment: reliant on external support                           ADL either performed or assessed with clinical judgement   ADL Overall ADL's : Needs assistance/impaired Eating/Feeding: Modified independent   Grooming: Modified independent;Wash/dry hands   Upper Body Bathing: Set up   Lower Body Bathing: Moderate assistance   Upper Body Dressing : Supervision/safety   Lower Body Dressing: Minimal assistance   Toilet Transfer: Min guard;Rollator (4 wheels);Ambulation;BSC/3in1   Toileting- Clothing Manipulation and Hygiene: Supervision/safety       Functional mobility during ADLs: Supervision/safety;Rolling walker (2 wheels)       Vision   Vision Assessment?: No apparent visual deficits     Perception     Praxis  Pertinent Vitals/Pain Pain Assessment Pain Assessment: Faces Pain Score: 0-No pain Faces Pain Scale: No hurt Pain Intervention(s): Limited activity within patient's  tolerance, Monitored during session     Hand Dominance     Extremity/Trunk Assessment Upper Extremity Assessment Upper Extremity Assessment: Generalized weakness   Lower Extremity Assessment Lower Extremity Assessment: Defer to PT evaluation   Cervical / Trunk Assessment Cervical / Trunk Assessment: Back Surgery   Communication Communication Communication: No difficulties   Cognition Arousal/Alertness: Awake/alert Behavior During Therapy: Anxious Overall Cognitive Status: Within Functional Limits for tasks assessed                                       General Comments  VSS on RA    Exercises     Shoulder Instructions      Home Living Family/patient expects to be discharged to:: Private residence Living Arrangements: Alone Available Help at Discharge: Neighbor;Available PRN/intermittently Type of Home: Apartment Home Access: Elevator     Home Layout: One level     Bathroom Shower/Tub: Chief Strategy Officer: Standard     Home Equipment: None          Prior Functioning/Environment Prior Level of Function : Independent/Modified Independent             Mobility Comments: no AD use ADLs Comments: ind        OT Problem List: Decreased strength;Decreased range of motion;Decreased activity tolerance;Impaired balance (sitting and/or standing);Decreased cognition      OT Treatment/Interventions: Self-care/ADL training;Therapeutic exercise;Energy conservation;DME and/or AE instruction;Therapeutic activities;Patient/family education;Balance training    OT Goals(Current goals can be found in the care plan section) Acute Rehab OT Goals Patient Stated Goal: none stated OT Goal Formulation: With patient Time For Goal Achievement: 02/18/22 Potential to Achieve Goals: Good ADL Goals Pt Will Perform Upper Body Dressing: with modified independence;standing;sitting Pt Will Perform Lower Body Dressing: with modified independence;sit  to/from stand;sitting/lateral leans Pt Will Transfer to Toilet: with modified independence;ambulating;regular height toilet Pt Will Perform Tub/Shower Transfer: with modified independence;3 in 1;rolling walker;ambulating Additional ADL Goal #1: Pt will complete bed mobility with supervision in prep for ADLs  OT Frequency: Min 2X/week    Co-evaluation              AM-PAC OT "6 Clicks" Daily Activity     Outcome Measure Help from another person eating meals?: None Help from another person taking care of personal grooming?: A Little Help from another person toileting, which includes using toliet, bedpan, or urinal?: A Little Help from another person bathing (including washing, rinsing, drying)?: A Lot Help from another person to put on and taking off regular upper body clothing?: A Little Help from another person to put on and taking off regular lower body clothing?: A Little 6 Click Score: 18   End of Session Equipment Utilized During Treatment: Rolling walker (2 wheels);Back brace Nurse Communication: Mobility status  Activity Tolerance: Patient tolerated treatment well Patient left: in bed;with call bell/phone within reach (sitting EOB)  OT Visit Diagnosis: Muscle weakness (generalized) (M62.81)                Time: 2458-0998 OT Time Calculation (min): 37 min Charges:  OT General Charges $OT Visit: 1 Visit OT Evaluation $OT Eval Low Complexity: 1 Low OT Treatments $Self Care/Home Management : 8-22 mins  Jerald Villalona, OTD, OTR/L Acute Rehab [phone]  Mayer Masker 02/04/2022, 9:24 AM

## 2022-02-04 NOTE — Evaluation (Signed)
Physical Therapy Evaluation  Patient Details Name: Melanie Guzman MRN: [id number] DOB: 1955-09-03 Today's Date: 02/04/2022  History of Present Illness  Pt is a 66 y/o F who presents s/p L4-L5 PLIF on 02/03/2022. PMH includes HTN, and pancreatitis.  Clinical Impression  Pt admitted with above diagnosis. At the time of PT eval, pt was able to demonstrate transfers and ambulation with gross min guard assist to supervision for safety and RW for support. Pt was educated on precautions, brace application/wearing schedule, appropriate activity progression, and car transfer. Pt appears to have some short term memory deficits, as she does not recall events from OT session which was less than an hour before PT session. Unsure of pt's baseline. Pt currently with functional limitations due to the deficits listed below (see PT Problem List). Pt will benefit from skilled PT to increase their independence and safety with mobility to allow discharge to the venue listed below.         Recommendations for follow up therapy are one component of a multi-disciplinary discharge planning process, led by the attending physician.  Recommendations may be updated based on patient status, additional functional criteria and insurance authorization.  Follow Up Recommendations Home health PT      Assistance Recommended at Discharge PRN  Patient can return home with the following  A little help with walking and/or transfers;A little help with bathing/dressing/bathroom;Assistance with cooking/housework;Assist for transportation    Equipment Recommendations Rolling walker (2 wheels);BSC/3in1  Recommendations for Other Services       Functional Status Assessment Patient has had a recent decline in their functional status and demonstrates the ability to make significant improvements in function in a reasonable and predictable amount of time.     Precautions / Restrictions Precautions Precautions: Back Precaution Booklet  Issued: Yes (comment) Precaution Comments: reviewed 3/3 back precautions Required Braces or Orthoses: Spinal Brace Spinal Brace: Lumbar corset;Applied in sitting position Other Brace: no brace needed per orders, however pt has brace Restrictions Weight Bearing Restrictions: No      Mobility  Bed Mobility Overal bed mobility: Needs Assistance Bed Mobility: Rolling, Sidelying to Sit, Sit to Sidelying Rolling: Supervision Sidelying to sit: Supervision     Sit to sidelying: Supervision General bed mobility comments: Increased time and effort to transition to/from EOB. Heavy use of bed rails.    Transfers Overall transfer level: Needs assistance Equipment used: Rolling walker (2 wheels) Transfers: Sit to/from Stand Sit to Stand: Min guard           General transfer comment: Close guard for safety as pt powered up to full stand. VC's for safe use of walker.    Ambulation/Gait Ambulation/Gait assistance: Supervision, Min guard Gait Distance (Feet): 500 Feet Assistive device: Rolling walker (2 wheels) Gait Pattern/deviations: Step-through pattern, Decreased stride length, Trunk flexed Gait velocity: Decreased Gait velocity interpretation: <1.31 ft/sec, indicative of household ambulator   General Gait Details: Close guard for safety progressing to supervision, regressing again to min guard as pt fatigued. VC's throughout for closer walker proximity, forward gaze, and improved posture.  Stairs            Wheelchair Mobility    Modified Rankin (Stroke Patients Only)       Balance Overall balance assessment: Needs assistance Sitting-balance support: Bilateral upper extremity supported, Feet unsupported Sitting balance-Leahy Scale: Good     Standing balance support: During functional activity, Reliant on assistive device for balance, Bilateral upper extremity supported Standing balance-Leahy Scale: Poor  Pertinent  Vitals/Pain Pain Assessment Pain Assessment: Faces Faces Pain Scale: Hurts even more Pain Location: RLE/hip/low back Pain Descriptors / Indicators: Aching, Sore Pain Intervention(s): Limited activity within patient's tolerance, Monitored during session, Repositioned    Home Living Family/patient expects to be discharged to:: Private residence Living Arrangements: Alone Available Help at Discharge: Neighbor;Available PRN/intermittently Type of Home: Apartment Home Access: Elevator       Home Layout: One level Home Equipment: None      Prior Function Prior Level of Function : Independent/Modified Independent             Mobility Comments: no AD use ADLs Comments: ind     Hand Dominance        Extremity/Trunk Assessment   Upper Extremity Assessment Upper Extremity Assessment: Defer to OT evaluation    Lower Extremity Assessment Lower Extremity Assessment: RLE deficits/detail RLE Deficits / Details: Decreased strength, and muscular fatigue with extended mobility.    Cervical / Trunk Assessment Cervical / Trunk Assessment: Back Surgery  Communication   Communication: No difficulties  Cognition Arousal/Alertness: Awake/alert Behavior During Therapy: Anxious Overall Cognitive Status: Impaired/Different from baseline Area of Impairment: Attention, Memory, Safety/judgement, Problem solving                   Current Attention Level: Sustained (Appears internally distracted at times) Memory: Decreased recall of precautions, Decreased short-term memory   Safety/Judgement: Decreased awareness of safety, Decreased awareness of deficits   Problem Solving: Slow processing, Difficulty sequencing, Requires verbal cues General Comments: Does not recall practicing shower transfer with OT less than an hour before PT session. Pt with difficulty sequencing through transfer and getting frustrated.        General Comments General comments (skin integrity, edema,  etc.): VSS on RA    Exercises     Assessment/Plan    PT Assessment Patient needs continued PT services  PT Problem List Decreased strength;Decreased activity tolerance;Decreased balance;Decreased mobility;Decreased knowledge of use of DME;Decreased safety awareness;Decreased knowledge of precautions;Pain;Decreased cognition       PT Treatment Interventions DME instruction;Gait training;Functional mobility training;Therapeutic activities;Therapeutic exercise;Balance training;Patient/family education    PT Goals (Current goals can be found in the Care Plan section)  Acute Rehab PT Goals Patient Stated Goal: Be independent at home PT Goal Formulation: With patient Time For Goal Achievement: 02/11/22 Potential to Achieve Goals: Good    Frequency Min 5X/week     Co-evaluation               AM-PAC PT "6 Clicks" Mobility  Outcome Measure Help needed turning from your back to your side while in a flat bed without using bedrails?: A Little Help needed moving from lying on your back to sitting on the side of a flat bed without using bedrails?: A Little Help needed moving to and from a bed to a chair (including a wheelchair)?: A Little Help needed standing up from a chair using your arms (e.g., wheelchair or bedside chair)?: A Little Help needed to walk in hospital room?: A Little Help needed climbing 3-5 steps with a railing? : A Little 6 Click Score: 18    End of Session Equipment Utilized During Treatment: Back brace Activity Tolerance: Patient tolerated treatment well Patient left: in bed;with call bell/phone within reach Nurse Communication: Mobility status PT Visit Diagnosis: Unsteadiness on feet (R26.81);Pain Pain - part of body:  (back)    Time: 2355-7322 PT Time Calculation (min) (ACUTE ONLY): 35 min   Charges:   PT Evaluation $  PT Eval Low Complexity: 1 Low PT Treatments $Gait Training: 8-22 mins        Conni Slipper, PT, DPT Acute Rehabilitation  Services Secure Chat Preferred Office: [phone]   Melanie Guzman 02/04/2022, 12:43 PM

## 2022-02-04 NOTE — Progress Notes (Signed)
Patient alert and oriented, voiding adequately, skin clean, dry and intact without evidence of skin break down, or symptoms of complications - no redness or edema noted, only slight tenderness at site.  Patient states pain is manageable at time of discharge. Patient has an appointment with MD in 2 weeks 

## 2022-02-04 NOTE — Discharge Summary (Signed)
Physician Discharge Summary  Patient ID: Melanie Guzman MRN: [id number] DOB/AGE: [birth year] 65 y.o.  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      Discharge Diagnoses: home   Discharged Condition: good  Hospital Course: The patient was admitted on 02/03/2022 and taken to the operating room where the patient underwent PLIF L4-5. The patient tolerated the procedure well and was taken to the recovery room and then to the floor in stable condition. The hospital course was routine. There were no complications. The wound remained clean dry and intact. Pt had appropriate back soreness. No complaints of leg pain or new N/T/W. The patient remained afebrile with stable vital signs, and tolerated a regular diet. The patient continued to increase activities, and pain was well controlled with oral pain medications.   Consults: None  Significant Diagnostic Studies:  Results for orders placed or performed during the hospital encounter of 02/03/22  ABO/Rh  Result Value Ref Range   ABO/RH(D)      O POS Performed at [facility], [address], [place], [place] [address]     DG Lumbar Spine 2-3 Views  Result Date: 02/03/2022 CLINICAL DATA:  Intraoperative utilization of fluoroscopy EXAM: LUMBAR SPINE - 2-3 VIEW COMPARISON:  None Available. FINDINGS: Intraoperative fluoroscopic images for L4-L5 posterior lumbar and interbody fusion. Total fluoroscopic time was 34 seconds, fluoroscopic dose was 31.08 mGy. IMPRESSION: Intraoperative utilization of fluoroscopy. Electronically Signed   By: Larose Hires D.O.   On: 02/03/2022 11:01   DG C-Arm 1-60 Min-No Report  Result Date: 02/03/2022 Fluoroscopy was utilized by the requesting physician.  No radiographic interpretation.   DG C-Arm 1-60 Min-No Report  Result Date: 02/03/2022 Fluoroscopy was utilized by the requesting  physician.  No radiographic interpretation.    Antibiotics:  Anti-infectives (From admission, onward)    Start     Dose/Rate Route Frequency Ordered Stop   02/03/22 1600  ceFAZolin (ANCEF) IVPB 2g/100 mL premix        2 g 200 mL/hr over 30 Minutes Intravenous Every 8 hours 02/03/22 1153 02/03/22 2258   02/03/22 0600  ceFAZolin (ANCEF) IVPB 2g/100 mL premix        2 g 200 mL/hr over 30 Minutes Intravenous On call to O.R. 02/03/22 0550 02/03/22 0805       Discharge Exam: Blood pressure 111/75, pulse 84, temperature 99.5 F (37.5 C), temperature source Oral, resp. rate 16, height 5\' 8"  (1.727 m), weight 99.8 kg, SpO2 97 %. Neurologic: Grossly normal Ambulating and voiding well incision cdi   Discharge Medications:   Allergies as of 02/04/2022   No Known Allergies      Medication List     STOP taking these medications    diclofenac 75 MG EC tablet Commonly known as: VOLTAREN       TAKE these medications    atorvastatin 20 MG tablet Commonly known as: LIPITOR Take 20 mg by mouth daily.   HYDROcodone-acetaminophen 5-325 MG tablet Commonly known as: NORCO/VICODIN Take 1 tablet by mouth every 4 (four) hours as needed for moderate pain.   methocarbamol 750 MG tablet Commonly known as: Robaxin-750 Take 1 tablet (750 mg total) by mouth 4 (four) times daily.   Prednisol Ace-Moxiflox-Bromfen 1-0.5-0.075 % Susp Place 1 drop into the left eye 3 (three) times daily.        Disposition: home   Final Dx: PLIF L4-5  Discharge Instructions  Remove dressing in 72 hours   Complete by: As directed    Call MD for:  difficulty breathing, headache or visual disturbances   Complete by: As directed    Call MD for:  hives   Complete by: As directed    Call MD for:  persistant dizziness or light-headedness   Complete by: As directed    Call MD for:  persistant nausea and vomiting   Complete by: As directed    Call MD for:  redness, tenderness, or signs of infection  (pain, swelling, redness, odor or green/yellow discharge around incision site)   Complete by: As directed    Call MD for:  severe uncontrolled pain   Complete by: As directed    Call MD for:  temperature >100.4   Complete by: As directed    Diet - low sodium heart healthy   Complete by: As directed    Driving Restrictions   Complete by: As directed    No driving for 2 weeks, no riding in the car for 1 week   Increase activity slowly   Complete by: As directed    Lifting restrictions   Complete by: As directed    No lifting more than 8 lbs          Signed: Tiana Loft Platon Arocho 02/04/2022, 7:47 AM

## 2022-02-04 NOTE — TOC Transition Note (Signed)
Transition of Care [facility]) - CM/SW Discharge Note   Patient Details  Name: Melanie Guzman MRN: [id number] Date of Birth: 10/07/1955  Transition of Care [facility]) CM/SW Contact:  Kermit Balo, RN Phone Number: 02/04/2022, 10:25 AM   Clinical Narrative:    Pt is discharging home with home health services through [place]. Information on the AVS.  Any needed DME to be delivered to the room per bedside RN. Pt has transport home.    Final next level of care: Home w Home Health Services Barriers to Discharge: No Barriers Identified   Patient Goals and CMS Choice   CMS Medicare.gov Compare Post Acute Care list provided to:: Patient Choice offered to / list presented to : Patient  Discharge Placement                       Discharge Plan and Services                          HH Arranged: PT, OT [facility] Agency: [facility] Date [facility] Agency Contacted: 02/04/22   Representative spoke with at [facility] Agency: Kandee Keen  Social Determinants of Health (SDOH) Interventions     Readmission Risk Interventions     No data to display

## 2022-08-24 ENCOUNTER — Other Ambulatory Visit: Payer: Self-pay

## 2022-08-24 ENCOUNTER — Emergency Department (HOSPITAL_COMMUNITY)
Admission: EM | Admit: 2022-08-24 | Discharge: 2022-08-24 | Disposition: A | Discharge status: Home / Self Care | Payer: Medicare Other | Attending: Emergency Medicine | Admitting: Emergency Medicine

## 2022-08-24 ENCOUNTER — Emergency Department (HOSPITAL_COMMUNITY): Payer: Medicare Other

## 2022-08-24 DIAGNOSIS — R112 Nausea with vomiting, unspecified: Secondary | ICD-10-CM | POA: Diagnosis not present

## 2022-08-24 DIAGNOSIS — R059 Cough, unspecified: Secondary | ICD-10-CM | POA: Insufficient documentation

## 2022-08-24 DIAGNOSIS — R110 Nausea: Secondary | ICD-10-CM

## 2022-08-24 DIAGNOSIS — R531 Weakness: Secondary | ICD-10-CM | POA: Diagnosis present

## 2022-08-24 DIAGNOSIS — F17200 Nicotine dependence, unspecified, uncomplicated: Secondary | ICD-10-CM | POA: Diagnosis not present

## 2022-08-24 DIAGNOSIS — R0981 Nasal congestion: Secondary | ICD-10-CM | POA: Insufficient documentation

## 2022-08-24 DIAGNOSIS — R0989 Other specified symptoms and signs involving the circulatory and respiratory systems: Secondary | ICD-10-CM | POA: Diagnosis not present

## 2022-08-24 DIAGNOSIS — Z20822 Contact with and (suspected) exposure to covid-19: Secondary | ICD-10-CM | POA: Insufficient documentation

## 2022-08-24 DIAGNOSIS — R1010 Upper abdominal pain, unspecified: Secondary | ICD-10-CM | POA: Insufficient documentation

## 2022-08-24 LAB — URINALYSIS, ROUTINE W REFLEX MICROSCOPIC
Bacteria, UA: NONE SEEN
Bilirubin Urine: NEGATIVE
Glucose, UA: NEGATIVE mg/dL
Ketones, ur: NEGATIVE mg/dL
Leukocytes,Ua: NEGATIVE
Nitrite: NEGATIVE
Protein, ur: NEGATIVE mg/dL
Specific Gravity, Urine: 1.003 — ABNORMAL LOW (ref 1.005–1.030)
pH: 5 (ref 5.0–8.0)

## 2022-08-24 LAB — CBC WITH DIFFERENTIAL/PLATELET
Abs Immature Granulocytes: 0.02 10*3/uL (ref 0.00–0.07)
Basophils Absolute: 0.1 10*3/uL (ref 0.0–0.1)
Basophils Relative: 1 %
Eosinophils Absolute: 0.1 10*3/uL (ref 0.0–0.5)
Eosinophils Relative: 1 %
HCT: 42.7 % (ref 36.0–46.0)
Hemoglobin: 13.8 g/dL (ref 12.0–15.0)
Immature Granulocytes: 0 %
Lymphocytes Relative: 33 %
Lymphs Abs: 2.6 10*3/uL (ref 0.7–4.0)
MCH: 28.8 pg (ref 26.0–34.0)
MCHC: 32.3 g/dL (ref 30.0–36.0)
MCV: 89 fL (ref 80.0–100.0)
Monocytes Absolute: 0.6 10*3/uL (ref 0.1–1.0)
Monocytes Relative: 7 %
Neutro Abs: 4.6 10*3/uL (ref 1.7–7.7)
Neutrophils Relative %: 58 %
Platelets: 224 10*3/uL (ref 150–400)
RBC: 4.8 MIL/uL (ref 3.87–5.11)
RDW: 14.1 % (ref 11.5–15.5)
WBC: 7.9 10*3/uL (ref 4.0–10.5)
nRBC: 0.3 % — ABNORMAL HIGH (ref 0.0–0.2)

## 2022-08-24 LAB — COMPREHENSIVE METABOLIC PANEL
ALT: 19 U/L (ref 0–44)
AST: 20 U/L (ref 15–41)
Albumin: 3.9 g/dL (ref 3.5–5.0)
Alkaline Phosphatase: 60 U/L (ref 38–126)
Anion gap: 10 (ref 5–15)
BUN: 6 mg/dL — ABNORMAL LOW (ref 8–23)
CO2: 25 mmol/L (ref 22–32)
Calcium: 9.6 mg/dL (ref 8.9–10.3)
Chloride: 103 mmol/L (ref 98–111)
Creatinine, Ser: 0.74 mg/dL (ref 0.44–1.00)
GFR, Estimated: >60 mL/min (ref >60)
Glucose, Bld: 139 mg/dL — ABNORMAL HIGH (ref 70–99)
Potassium: 3.6 mmol/L (ref 3.5–5.1)
Sodium: 138 mmol/L (ref 135–145)
Total Bilirubin: 0.6 mg/dL (ref 0.3–1.2)
Total Protein: 7.3 g/dL (ref 6.5–8.1)

## 2022-08-24 LAB — RESP PANEL BY RT-PCR (RSV, FLU A&B, COVID)  RVPGX2
Influenza A by PCR: NEGATIVE
Influenza B by PCR: NEGATIVE
Resp Syncytial Virus by PCR: NEGATIVE
SARS Coronavirus 2 by RT PCR: NEGATIVE

## 2022-08-24 LAB — LIPASE, BLOOD: Lipase: 34 U/L (ref 11–51)

## 2022-08-24 MED ORDER — ONDANSETRON HCL 4 MG/2ML IJ SOLN
4.0000 mg | Freq: Once | INTRAMUSCULAR | Status: AC
  Administered 2022-08-24: 4 mg via INTRAVENOUS
  Filled 2022-08-24: qty 2

## 2022-08-24 MED ORDER — ONDANSETRON 4 MG PO TBDP: 4.0000 mg | ORAL_TABLET | Freq: Three times a day (TID) | ORAL | 0 refills | Status: DC | PRN

## 2022-08-24 MED ORDER — SODIUM CHLORIDE 0.9 % IV BOLUS
1000.0000 mL | Freq: Once | INTRAVENOUS | Status: AC
  Administered 2022-08-24: 1000 mL via INTRAVENOUS

## 2022-08-24 NOTE — ED Notes (Signed)
Pt given drink and crackers  

## 2022-08-24 NOTE — ED Provider Notes (Signed)
[place] EMERGENCY DEPARTMENT AT [facility] Provider Note   CSN: [id number] Arrival date & time: 08/24/22  1349     History {Add pertinent medical, surgical, social history, OB history to HPI:1} Chief Complaint  Patient presents with   Weakness    Melanie Guzman is a 67 y.o. female.  She is here with few days of headache and nasal congestion, cough productive of some greenish phlegm, nausea with 1 episode of vomiting, feeling generally weak.  No chest pain.  Little bit of upper abdominal discomfort.  No diarrhea no urinary symptoms.  She has not had a fever but she is felt chilled.  She is a smoker.   Influenza Presenting symptoms: cough, headache, nausea, rhinorrhea and vomiting   Headaches:    Severity:  Moderate   Onset quality:  Gradual   Timing:  Intermittent   Progression:  Unchanged   Chronicity:  New Associated symptoms: chills and nasal congestion   Associated symptoms: no neck stiffness   Risk factors: no immunocompromised state        Home Medications Prior to Admission medications   Medication Sig Start Date End Date Taking? Authorizing Provider  atorvastatin (LIPITOR) 20 MG tablet Take 20 mg by mouth daily. 11/27/21   [provider]  HYDROcodone-acetaminophen (NORCO/VICODIN) 5-325 MG tablet Take 1 tablet by mouth every 4 (four) hours as needed for moderate pain. 02/04/22 02/04/23  Meyran, Ocie Cornfield, NP  methocarbamol (ROBAXIN-750) 750 MG tablet Take 1 tablet (750 mg total) by mouth 4 (four) times daily. 02/04/22   Meyran, Ocie Cornfield, NP  Prednisol Ace-Moxiflox-Bromfen 1-0.5-0.075 % SUSP Place 1 drop into the left eye 3 (three) times daily. 12/23/21   [provider]      Allergies    Patient has no known allergies.    Review of Systems   Review of Systems  Constitutional:  Positive for chills.  HENT:  Positive for congestion, rhinorrhea and sinus pressure.   Eyes:  Negative for visual disturbance.  Respiratory:   Positive for cough.   Cardiovascular:  Negative for chest pain.  Gastrointestinal:  Positive for abdominal pain, nausea and vomiting.  Genitourinary:  Negative for dysuria.  Musculoskeletal:  Negative for neck stiffness.  Neurological:  Positive for headaches.    Physical Exam Updated Vital Signs BP 135/85 (BP Location: Right Arm)   Pulse 78   Temp 98.4 F (36.9 C) (Oral)   Resp 16   Ht '5\' 8"'$  (1.727 m)   Wt 106.6 kg   SpO2 99%   BMI 35.73 kg/m  Physical Exam Vitals and nursing note reviewed.  Constitutional:      General: She is not in acute distress.    Appearance: Normal appearance. She is well-developed.  HENT:     Head: Normocephalic and atraumatic.  Eyes:     Conjunctiva/sclera: Conjunctivae normal.  Cardiovascular:     Rate and Rhythm: Normal rate and regular rhythm.     Heart sounds: No murmur heard. Pulmonary:     Effort: Pulmonary effort is normal. No respiratory distress.     Breath sounds: Normal breath sounds.  Abdominal:     Palpations: Abdomen is soft.     Tenderness: There is no abdominal tenderness. There is no guarding or rebound.  Musculoskeletal:        General: No deformity. Normal range of motion.     Cervical back: Neck supple.  Skin:    General: Skin is warm and dry.     Capillary Refill:  Capillary refill takes less than 2 seconds.  Neurological:     General: No focal deficit present.     Mental Status: She is alert.     ED Results / Procedures / Treatments   Labs (all labs ordered are listed, but only abnormal results are displayed) Labs Reviewed - No data to display  EKG None  Radiology No results found.  Procedures Procedures  {Document cardiac monitor, telemetry assessment procedure when appropriate:1}  Medications Ordered in ED Medications  sodium chloride 0.9 % bolus 1,000 mL (has no administration in time range)  ondansetron (ZOFRAN) injection 4 mg (has no administration in time range)    ED Course/ Medical Decision  Making/ A&P   {   Click here for ABCD2, HEART and other calculatorsREFRESH Note before signing :1}                          Medical Decision Making Amount and/or Complexity of Data Reviewed Labs: ordered. Radiology: ordered.  Risk Prescription drug management.   This patient complains of ***; this involves an extensive number of treatment Options and is a complaint that carries with it a high risk of complications and morbidity. The differential includes ***  I ordered, reviewed and interpreted labs, which included *** I ordered medication *** and reviewed PMP when indicated. I ordered imaging studies which included *** and I independently    visualized and interpreted imaging which showed *** Additional history obtained from *** Previous records obtained and reviewed *** I consulted *** and discussed lab and imaging findings and discussed disposition.  Cardiac monitoring reviewed, *** Social determinants considered, *** Critical Interventions: ***  After the interventions stated above, I reevaluated the patient and found *** Admission and further testing considered, ***   {Document critical care time when appropriate:1} {Document review of labs and clinical decision tools ie heart score, Chads2Vasc2 etc:1}  {Document your independent review of radiology images, and any outside records:1} {Document your discussion with family members, caretakers, and with consultants:1} {Document social determinants of health affecting pt's care:1} {Document your decision making why or why not admission, treatments were needed:1} Final Clinical Impression(s) / ED Diagnoses Final diagnoses:  None    Rx / DC Orders ED Discharge Orders     None

## 2022-08-24 NOTE — Discharge Instructions (Signed)
You were seen in the department for feeling weak and nauseous.  Your chest x-ray did not show any pneumonia and your COVID and flu test were negative and your lab work was unremarkable.  You were given some fluids and nausea medication and we are going to prescribe you some nausea medication.  Please continue your regular medications and follow-up with your primary care doctor.  Return to the emergency department if any worsening or concerning symptoms.

## 2022-08-24 NOTE — ED Triage Notes (Signed)
Pt reports weakness, nausea and vomiting x 1 week

## 2022-10-07 ENCOUNTER — Encounter (HOSPITAL_COMMUNITY): Payer: Self-pay | Admitting: *Deleted

## 2022-10-07 ENCOUNTER — Other Ambulatory Visit: Payer: Self-pay

## 2022-10-07 ENCOUNTER — Emergency Department (HOSPITAL_COMMUNITY)
Admission: EM | Admit: 2022-10-07 | Discharge: 2022-10-07 | Discharge status: Against Medical Advice | Payer: Medicare Other | Attending: Emergency Medicine | Admitting: Emergency Medicine

## 2022-10-07 DIAGNOSIS — Z5321 Procedure and treatment not carried out due to patient leaving prior to being seen by health care provider: Secondary | ICD-10-CM | POA: Insufficient documentation

## 2022-10-07 DIAGNOSIS — R112 Nausea with vomiting, unspecified: Secondary | ICD-10-CM | POA: Diagnosis not present

## 2022-10-07 DIAGNOSIS — R1012 Left upper quadrant pain: Secondary | ICD-10-CM | POA: Diagnosis present

## 2022-10-07 DIAGNOSIS — R519 Headache, unspecified: Secondary | ICD-10-CM | POA: Insufficient documentation

## 2022-10-07 LAB — COMPREHENSIVE METABOLIC PANEL
ALT: 24 U/L (ref 0–44)
AST: 19 U/L (ref 15–41)
Albumin: 4.4 g/dL (ref 3.5–5.0)
Alkaline Phosphatase: 68 U/L (ref 38–126)
Anion gap: 9 (ref 5–15)
BUN: 8 mg/dL (ref 8–23)
CO2: 25 mmol/L (ref 22–32)
Calcium: 10 mg/dL (ref 8.9–10.3)
Chloride: 100 mmol/L (ref 98–111)
Creatinine, Ser: 0.66 mg/dL (ref 0.44–1.00)
GFR, Estimated: >60 mL/min (ref >60)
Glucose, Bld: 187 mg/dL — ABNORMAL HIGH (ref 70–99)
Potassium: 3.7 mmol/L (ref 3.5–5.1)
Sodium: 134 mmol/L — ABNORMAL LOW (ref 135–145)
Total Bilirubin: 0.8 mg/dL (ref 0.3–1.2)
Total Protein: 8.6 g/dL — ABNORMAL HIGH (ref 6.5–8.1)

## 2022-10-07 LAB — CBC WITH DIFFERENTIAL/PLATELET
Abs Immature Granulocytes: 0.04 10*3/uL (ref 0.00–0.07)
Basophils Absolute: 0.1 10*3/uL (ref 0.0–0.1)
Basophils Relative: 1 %
Eosinophils Absolute: 0.1 10*3/uL (ref 0.0–0.5)
Eosinophils Relative: 1 %
HCT: 45.7 % (ref 36.0–46.0)
Hemoglobin: 15.5 g/dL — ABNORMAL HIGH (ref 12.0–15.0)
Immature Granulocytes: 0 %
Lymphocytes Relative: 26 %
Lymphs Abs: 2.5 10*3/uL (ref 0.7–4.0)
MCH: 29.4 pg (ref 26.0–34.0)
MCHC: 33.9 g/dL (ref 30.0–36.0)
MCV: 86.7 fL (ref 80.0–100.0)
Monocytes Absolute: 0.6 10*3/uL (ref 0.1–1.0)
Monocytes Relative: 6 %
Neutro Abs: 6.4 10*3/uL (ref 1.7–7.7)
Neutrophils Relative %: 66 %
Platelets: 228 10*3/uL (ref 150–400)
RBC: 5.27 MIL/uL — ABNORMAL HIGH (ref 3.87–5.11)
RDW: 13.7 % (ref 11.5–15.5)
WBC: 9.7 10*3/uL (ref 4.0–10.5)
nRBC: 0 % (ref 0.0–0.2)

## 2022-10-07 LAB — URINALYSIS, ROUTINE W REFLEX MICROSCOPIC
Bilirubin Urine: NEGATIVE
Glucose, UA: NEGATIVE mg/dL
Hgb urine dipstick: NEGATIVE
Ketones, ur: NEGATIVE mg/dL
Leukocytes,Ua: NEGATIVE
Nitrite: NEGATIVE
Protein, ur: NEGATIVE mg/dL
Specific Gravity, Urine: 1.012 (ref 1.005–1.030)
pH: 6 (ref 5.0–8.0)

## 2022-10-07 LAB — LIPASE, BLOOD: Lipase: 68 U/L — ABNORMAL HIGH (ref 11–51)

## 2022-10-07 NOTE — ED Triage Notes (Signed)
Pt has had left sided abdominal pain which increases with eating for about 2 weeks. In the last week this has become severe.  Pt has been having nausea and vomiting with this and it has been getting worse.  Pt reports that she has not been able to eat due to this.

## 2022-10-07 NOTE — ED Provider Triage Note (Signed)
Emergency Medicine Provider Triage Evaluation Note  Melanie Guzman , a 67 y.o. female  was evaluated in triage.  Pt complains of left upper quadrant abdominal pain, associated with eating, ongoing for 2 weeks. No prior GI history. Occasionally feels better after passing of flatus. Urine has been dark, with foul smell. Mild frontal headache.  Review of Systems  Positive: LUQ pain, N/V Negative: Chest pain, shortness of breath  Physical Exam  BP (!) 140/85 (BP Location: Left Arm)   Pulse 79   Temp 99 F (37.2 C) (Oral)   Resp 17   SpO2 97%  Gen:   Awake, no distress   Resp:  Normal effort  MSK:   Moves extremities without difficulty  Other:  TTP LUQ,. Abdomen otherwise soft.  Medical Decision Making  Medically screening exam initiated at 6:46 PM.  Appropriate orders placed.  Juhi Arvanitis was informed that the remainder of the evaluation will be completed by another provider, this initial triage assessment does not replace that evaluation, and the importance of remaining in the ED until their evaluation is complete.     Felicie Morn, NP 10/07/22 [phone]

## 2022-10-07 NOTE — ED Notes (Signed)
Per registration, patient walked out of ED at 1937. Decided they no longer wanted to wait.

## 2022-10-08 ENCOUNTER — Encounter (HOSPITAL_COMMUNITY): Payer: Self-pay

## 2022-10-08 ENCOUNTER — Emergency Department (HOSPITAL_COMMUNITY): Payer: Medicare Other

## 2022-10-08 ENCOUNTER — Emergency Department (HOSPITAL_COMMUNITY)
Admission: EM | Admit: 2022-10-08 | Discharge: 2022-10-08 | Disposition: A | Discharge status: Home / Self Care | Payer: Medicare Other | Attending: Emergency Medicine | Admitting: Emergency Medicine

## 2022-10-08 DIAGNOSIS — N3001 Acute cystitis with hematuria: Secondary | ICD-10-CM | POA: Insufficient documentation

## 2022-10-08 DIAGNOSIS — K859 Acute pancreatitis without necrosis or infection, unspecified: Secondary | ICD-10-CM | POA: Insufficient documentation

## 2022-10-08 DIAGNOSIS — R1010 Upper abdominal pain, unspecified: Secondary | ICD-10-CM

## 2022-10-08 DIAGNOSIS — R109 Unspecified abdominal pain: Secondary | ICD-10-CM | POA: Diagnosis present

## 2022-10-08 LAB — URINALYSIS, ROUTINE W REFLEX MICROSCOPIC
Bilirubin Urine: NEGATIVE
Glucose, UA: NEGATIVE mg/dL
Ketones, ur: 20 mg/dL — AB
Nitrite: NEGATIVE
Protein, ur: 30 mg/dL — AB
Specific Gravity, Urine: 1.025 (ref 1.005–1.030)
pH: 5 (ref 5.0–8.0)

## 2022-10-08 MED ORDER — SODIUM CHLORIDE 0.9 % IV BOLUS
1000.0000 mL | Freq: Once | INTRAVENOUS | Status: AC
  Administered 2022-10-08: 1000 mL via INTRAVENOUS

## 2022-10-08 MED ORDER — ONDANSETRON 4 MG PO TBDP: 4.0000 mg | ORAL_TABLET | Freq: Three times a day (TID) | ORAL | 0 refills | Status: AC | PRN

## 2022-10-08 MED ORDER — IOHEXOL 300 MG/ML  SOLN
100.0000 mL | Freq: Once | INTRAMUSCULAR | Status: AC | PRN
  Administered 2022-10-08: 100 mL via INTRAVENOUS

## 2022-10-08 MED ORDER — CEPHALEXIN 500 MG PO CAPS: 500.0000 mg | ORAL_CAPSULE | Freq: Two times a day (BID) | ORAL | 0 refills | Status: AC

## 2022-10-08 MED ORDER — SODIUM CHLORIDE (PF) 0.9 % IJ SOLN
INTRAMUSCULAR | Status: AC
  Filled 2022-10-08: qty 50

## 2022-10-08 NOTE — Discharge Instructions (Addendum)
It was a pleasure taking care of you today!   Your labs from yesterday's ED visit were overall unremarkable, did this show a slightly elevated lipase.  Your CT scan today showed concerns for pancreatitis.  Your urine was also notable for UTI today.  It is important at home and to ensure to maintain fluid intake with water, tea, broth, soup, Gatorade, Pedialyte.  May be prudent for the next week that she continue with a bland diet.  We will send a prescription for Zofran, take as needed for nausea/vomiting.  If you have to take the Zofran, wait at least 30 minutes before you attempt small sips/small bites of food/fluid. You will be sent a prescription for Keflex, take as prescribed. Ensure that you complete the entire course of antibiotic. Ensure to maintain fluid intake.  You may follow-up with your primary care provider as needed.  Return to the emergency department if you are experiencing increasing or worsening abdominal pain, urinary symptoms, fever, or decreased fluid intake.

## 2022-10-08 NOTE — ED Triage Notes (Signed)
Patient reports she was here yesterday for abd pain and had labwork done but left due to wait time. Went home and drank milk of magnesia to help pain and sleep then came back in this morning wanting her results. Still having abd pain at this time on the left side. Endorses n/v on Sunday but none currently.

## 2022-10-08 NOTE — ED Provider Notes (Signed)
[place] EMERGENCY DEPARTMENT AT [facility] Provider Note   CSN: [id number] Arrival date & time: 10/08/22  9147     History  Chief Complaint  Patient presents with   Abdominal Pain    Melanie Guzman is a 67 y.o. female who presents emergency department with concerns for left-sided abdominal pain onset 5 days.  Has associated resolved nausea and vomiting (had 1 episode 5 days ago), constipation (last bowel movement was 4-5 days ago (took laxative 6 days ago), malodorous urine.  No meds tried prior to arrival.  Denies diarrhea, dysuria, hematuria, fever.  Patient is a still has her gallbladder and appendix.  Denies EtOH use.   The history is provided by the patient. No language interpreter was used.       Home Medications Prior to Admission medications   Medication Sig Start Date End Date Taking? Authorizing Provider  cephALEXin (KEFLEX) 500 MG capsule Take 1 capsule (500 mg total) by mouth 2 (two) times daily for 5 days. 10/08/22 10/13/22 Yes Rachelann Enloe A, PA-C  atorvastatin (LIPITOR) 20 MG tablet Take 20 mg by mouth daily. 11/27/21   [provider]  HYDROcodone-acetaminophen (NORCO/VICODIN) 5-325 MG tablet Take 1 tablet by mouth every 4 (four) hours as needed for moderate pain. 02/04/22 02/04/23  Meyran, Tiana Loft, NP  methocarbamol (ROBAXIN-750) 750 MG tablet Take 1 tablet (750 mg total) by mouth 4 (four) times daily. 02/04/22   Meyran, Tiana Loft, NP  ondansetron (ZOFRAN-ODT) 4 MG disintegrating tablet Take 1 tablet (4 mg total) by mouth every 8 (eight) hours as needed for nausea or vomiting. 10/08/22   Patina Spanier A, PA-C  Prednisol Ace-Moxiflox-Bromfen 1-0.5-0.075 % SUSP Place 1 drop into the left eye 3 (three) times daily. 12/23/21   [provider]      Allergies    Patient has no known allergies.    Review of Systems   Review of Systems  Gastrointestinal:  Positive for abdominal pain.  All other systems reviewed and are  negative.   Physical Exam Updated Vital Signs BP (!) 154/96 (BP Location: Left Arm)   Pulse 79   Temp 97.8 F (36.6 C) (Oral)   Resp 18   SpO2 94%  Physical Exam Vitals and nursing note reviewed.  Constitutional:      General: She is not in acute distress.    Appearance: She is not diaphoretic.  HENT:     Head: Normocephalic and atraumatic.     Mouth/Throat:     Pharynx: No oropharyngeal exudate.  Eyes:     General: No scleral icterus.    Conjunctiva/sclera: Conjunctivae normal.  Cardiovascular:     Rate and Rhythm: Normal rate and regular rhythm.     Pulses: Normal pulses.     Heart sounds: Normal heart sounds.  Pulmonary:     Effort: Pulmonary effort is normal. No respiratory distress.     Breath sounds: Normal breath sounds. No wheezing.  Abdominal:     General: Bowel sounds are normal.     Palpations: Abdomen is soft. There is no mass.     Tenderness: There is abdominal tenderness in the left upper quadrant and left lower quadrant. There is no guarding or rebound.     Comments: Left sided abdominal TTP.   Musculoskeletal:        General: Normal range of motion.     Cervical back: Normal range of motion and neck supple.  Skin:    General: Skin is warm and dry.  Neurological:     Mental Status: She is alert.  Psychiatric:        Behavior: Behavior normal.     ED Results / Procedures / Treatments   Labs (all labs ordered are listed, but only abnormal results are displayed) Labs Reviewed  URINALYSIS, ROUTINE W REFLEX MICROSCOPIC - Abnormal; Notable for the following components:      Result Value   Color, Urine AMBER (*)    APPearance HAZY (*)    Hgb urine dipstick MODERATE (*)    Ketones, ur 20 (*)    Protein, ur 30 (*)    Leukocytes,Ua TRACE (*)    Bacteria, UA FEW (*)    All other components within normal limits    EKG None  Radiology CT ABDOMEN PELVIS W CONTRAST  Result Date: 10/08/2022 CLINICAL DATA:  Left lower quadrant abdominal pain. EXAM:  CT ABDOMEN AND PELVIS WITH CONTRAST TECHNIQUE: Multidetector CT imaging of the abdomen and pelvis was performed using the standard protocol following bolus administration of intravenous contrast. RADIATION DOSE REDUCTION: This exam was performed according to the departmental dose-optimization program which includes automated exposure control, adjustment of the mA and/or kV according to patient size and/or use of iterative reconstruction technique. CONTRAST:  OMNIPAQUE IOHEXOL 300 MG/ML  SOLN COMPARISON:  February 19, 2017. FINDINGS: Lower chest: No acute abnormality. Hepatobiliary: No focal liver abnormality is seen. No gallstones, gallbladder wall thickening, or biliary dilatation. Pancreas: Mild inflammatory changes are seen involving the pancreatic body and tail suggesting acute pancreatitis. No pseudocyst formation is noted. No ductal dilatation is noted. Spleen: Normal in size without focal abnormality. Adrenals/Urinary Tract: Adrenal glands are unremarkable. Kidneys are normal, without renal calculi, focal lesion, or hydronephrosis. Bladder is unremarkable. Stomach/Bowel: Stomach is within normal limits. Appendix appears normal. No evidence of bowel wall thickening, distention, or inflammatory changes. Vascular/Lymphatic: Aortic atherosclerosis. No enlarged abdominal or pelvic lymph nodes. Reproductive: Status post hysterectomy. No adnexal masses. Other: No abdominal wall hernia or abnormality. No abdominopelvic ascites. Musculoskeletal: Status post surgical posterior fusion of L4-5. No acute osseous abnormality is noted. IMPRESSION: Findings consistent with acute pancreatitis. No definite pseudocyst formation is noted. Aortic Atherosclerosis (ICD10-I70.0). Electronically Signed   By: Lupita Raider M.D.   On: 10/08/2022 09:31    Procedures Procedures    Medications Ordered in ED Medications  iohexol (OMNIPAQUE) 300 MG/ML solution 100 mL (100 mLs Intravenous Contrast Given 10/08/22 0904)  sodium  chloride 0.9 % bolus 1,000 mL (0 mLs Intravenous Stopped 10/08/22 1100)    ED Course/ Medical Decision Making/ A&P Clinical Course as of 10/08/22 1328  Wed Oct 08, 2022  0940 Discussed with patient lab and imaging findings. Discussed treatment plan. Pt agreeable at this time.  [SB]  1112 Re-evaluated and noted to be sitting on the side of the bed, patient notes that she is ready to go at this time. Pt notes that she had some pain after eating a sandwich in the ED. Offered pain medication to patient today. Pt declines at this time and notes that she would like to go home. Discussed discharge treatment plan. Pt agreeable at this time. Pt appears safe for discharge. [SB]    Clinical Course User Index [SB] Lacresha Fusilier A, PA-C                             Medical Decision Making Amount and/or Complexity of Data Reviewed Labs: ordered. Radiology: ordered.  Risk Prescription drug  management.   Patient presents to the emergency department with left sided abdominal pain x 5 days. No history of ETOH use.  Patient still has her gallbladder.  Patient afebrile.  On exam patient with left-sided abdominal tenderness to palpation.  Differential diagnosis includes pancreatitis, diverticulitis, cholecystitis, acute cystitis.   Additional history obtained:  External records from outside source obtained and reviewed including: Patient was triaged in the emergency department last night however left due to the wait.  Labs:  I ordered, and personally interpreted labs.  The pertinent results include:  Urinalysis today notable for moderate amount of hemoglobin, trace leukocytes, few bacteria. Labs from 10/07/2022 notable for lipase slightly elevated at 68 otherwise remainder of labs, CMP, CBC unremarkable.  Imaging: I ordered imaging studies including CT abdomen pelvis with I independently visualized and interpreted imaging which showed  Findings consistent with acute pancreatitis. No definite pseudocyst   formation is noted.    Aortic Atherosclerosis (ICD10-I70.0).   I agree with the radiologist interpretation  Medications:  I ordered medication including IVF for symptom management.  Reevaluation of the patient after these medicines and interventions, I reevaluated the patient and found that they have improved I have reviewed the patients home medicines and have made adjustments as needed Tolerated PO challenge in ED with above treatment regimen     Disposition: Presenting suspicious for pancreatitis.  Doubt concerns at this time for diverticulitis, cholecystitis.  Also notable for acute cystitis. After consideration of the diagnostic results and the patients response to treatment, I feel that the patient would benefit from Discharge home.  Patient to be discharged home with a prescription for Keflex and Zofran.  Patient instructed to follow-up with GI specialist regarding today's ED visit.  Instructed to continue with maintaining fluid intake.  Also instructed on a bland diet for the next week. Supportive care measures and strict return precautions discussed with patient at bedside. Pt acknowledges and verbalizes understanding. Pt appears safe for discharge. Follow up as indicated in discharge paperwork.   This chart was dictated using voice recognition software, Dragon. Despite the best efforts of this provider to proofread and correct errors, errors may still occur which can change documentation meaning.   Final Clinical Impression(s) / ED Diagnoses Final diagnoses:  Upper abdominal pain  Acute pancreatitis without infection or necrosis, unspecified pancreatitis type  Acute cystitis with hematuria    Rx / DC Orders ED Discharge Orders          Ordered    cephALEXin (KEFLEX) 500 MG capsule  2 times daily        10/08/22 1121    ondansetron (ZOFRAN-ODT) 4 MG disintegrating tablet  Every 8 hours PRN        10/08/22 1122              Romy Ipock A, PA-C 10/08/22 1329     Margarita Grizzle, MD 10/13/22 [phone]

## 2023-04-13 ENCOUNTER — Other Ambulatory Visit: Payer: Self-pay | Admitting: Nurse Practitioner

## 2023-04-13 DIAGNOSIS — R0989 Other specified symptoms and signs involving the circulatory and respiratory systems: Secondary | ICD-10-CM

## 2023-04-28 ENCOUNTER — Other Ambulatory Visit: Payer: Medicare HMO

## 2023-04-29 ENCOUNTER — Ambulatory Visit
Admission: RE | Admit: 2023-04-29 | Discharge: 2023-04-29 | Disposition: A | Discharge status: Home / Self Care | Payer: Medicare HMO | Source: Ambulatory Visit | Attending: Nurse Practitioner | Admitting: Nurse Practitioner

## 2023-04-29 DIAGNOSIS — R0989 Other specified symptoms and signs involving the circulatory and respiratory systems: Secondary | ICD-10-CM

## 2023-05-06 ENCOUNTER — Other Ambulatory Visit: Payer: Self-pay | Admitting: Nurse Practitioner

## 2023-05-06 DIAGNOSIS — E2839 Other primary ovarian failure: Secondary | ICD-10-CM

## 2023-05-06 DIAGNOSIS — Z1231 Encounter for screening mammogram for malignant neoplasm of breast: Secondary | ICD-10-CM

## 2023-10-28 LAB — GLUCOSE, POCT (MANUAL RESULT ENTRY): Glucose Fasting, POC: 126 mg/dL — AB (ref 70–99)

## 2023-10-28 NOTE — Progress Notes (Signed)
 Patient came to mobile screening at [employer]. BP 134/92. Pt stated that she had been dealing with stress but was [place] working on eliminating that. Blood sugar 126. We discussed regular PCP visits (resource provided) and maintaining an active healthy lifestyle. Food SDOH indicated. [employer] given.

## 2024-01-20 NOTE — Progress Notes (Unsigned)
 The patient attended a screening event on _______, where her blood pressure was measured at ______ mmHg, and her A1C was _____%, placing her in the diabetic range. During the event, the patient reported that he/she does/does not smoke, has insurance, is/is'n't established with a primary care provider (PCP), and does/does not have identified social determinants of health (SDOH) concerns. A chart review confirmed that the patient has/does not have an active PCP.  The patient's most recent office visit was on ______, where she was referred by her PCP for _____.  ________ is appropriately listed in her medical record, and a follow-up appointment with her PCP is scheduled for _______.  The patient demonstrates consistent engagement in her care. At this time, no additional support from the Health Equity Team is indicated./An additional follow up will be done at a later date per the Health Equity Teams protocol.

## 2024-03-10 NOTE — Progress Notes (Signed)
 The patient attended a screening event on 10/29/2023 where her screening results were a BP of 134/90 and a blood glucose of 126 . At the event the patient noted she does have a PCP [facility]), is a smoker, and did not indicate any insurance. Patient indicated having a food SDOH need at the time of the event.   Per chart review patient does not have a PCP, has [employer] and [employer] for insurance, and has a smoking SDOH need. There are no [facility] visible appts within the last 12 months.  [facility] attempted to reach out for 60 day f/u and patient could not be reached due to call not being completed as dialed. Letter along with PCP, insurance, food, and smoking resources mailed. An additional follow up will be done in according to the health equity team's protocol.

## 2024-03-25 ENCOUNTER — Inpatient Hospital Stay: Admission: RE | Admit: 2024-03-25 | Source: Ambulatory Visit

## 2024-06-29 NOTE — Progress Notes (Signed)
 Pt attended 10/29/2023 screening event with BP of 134/90 and blood sugar was 126. Pt noted at event that she does have a PCP. At event pt did indicate food SDOH needs. Pt also noted that she is a smoker at the event.   Per 6 month f/u pt was reached out via phone and call could not be completed. [facility] called pt's PCP office and front desk confirmed that pt is still being seen and verified last appt. Pt was mailed letter with food resources, [place] flyer, reduced sodium alternative flyer, BP management flyer, BP log, smoking cessation flyer, and [place] [address] card.  Per chart review pt does have a PCP, insurance, and is a smoker. Pt's last appt with PCP was 02/09/2024. Per recent [facility] encounter (pt declined-updated SDOH wheel) pt does not indicate any SDOH needs at this time.  No additional pt f/u to be scheduled at this time per health equity protocol.
